# Patient Record
Sex: Male | Born: 1950 | Race: White | Hispanic: No | Smoking: Current every day smoker
Health system: Southern US, Community
[De-identification: ages and names within clinical notes are randomized; demographics above are authoritative.]

## PROBLEM LIST (undated history)

## (undated) DIAGNOSIS — Z8619 Personal history of other infectious and parasitic diseases: Secondary | ICD-10-CM

## (undated) DIAGNOSIS — I10 Essential (primary) hypertension: Secondary | ICD-10-CM

## (undated) DIAGNOSIS — E7800 Pure hypercholesterolemia, unspecified: Secondary | ICD-10-CM

## (undated) DIAGNOSIS — T7840XA Allergy, unspecified, initial encounter: Secondary | ICD-10-CM

## (undated) HISTORY — DX: Allergy, unspecified, initial encounter: T78.40XA

## (undated) HISTORY — DX: Personal history of other infectious and parasitic diseases: Z86.19

## (undated) HISTORY — PX: OTHER SURGICAL HISTORY: SHX169

---

## 2002-02-14 ENCOUNTER — Emergency Department (HOSPITAL_COMMUNITY): Admission: EM | Admit: 2002-02-14 | Discharge: 2002-02-14 | Payer: Self-pay

## 2003-06-28 ENCOUNTER — Emergency Department (HOSPITAL_COMMUNITY): Admission: EM | Admit: 2003-06-28 | Discharge: 2003-06-28 | Payer: Self-pay | Admitting: Emergency Medicine

## 2003-07-13 ENCOUNTER — Emergency Department (HOSPITAL_COMMUNITY): Admission: EM | Admit: 2003-07-13 | Discharge: 2003-07-13 | Payer: Self-pay | Admitting: Emergency Medicine

## 2005-05-14 ENCOUNTER — Emergency Department (HOSPITAL_COMMUNITY): Admission: EM | Admit: 2005-05-14 | Discharge: 2005-05-14 | Payer: Self-pay | Admitting: Emergency Medicine

## 2005-08-10 ENCOUNTER — Emergency Department (HOSPITAL_COMMUNITY): Admission: EM | Admit: 2005-08-10 | Discharge: 2005-08-11 | Payer: Self-pay | Admitting: Emergency Medicine

## 2005-08-16 ENCOUNTER — Encounter: Admission: RE | Admit: 2005-08-16 | Discharge: 2005-08-16 | Payer: Self-pay | Admitting: Family Medicine

## 2011-09-18 ENCOUNTER — Telehealth: Payer: Self-pay

## 2011-09-18 NOTE — Telephone Encounter (Signed)
Opened in error

## 2011-09-19 ENCOUNTER — Ambulatory Visit (INDEPENDENT_AMBULATORY_CARE_PROVIDER_SITE_OTHER): Payer: 59 | Admitting: Internal Medicine

## 2011-09-19 ENCOUNTER — Encounter: Payer: Self-pay | Admitting: Internal Medicine

## 2011-09-19 VITALS — BP 122/80 | HR 80 | Temp 98.2°F | Resp 20 | Ht 72.5 in | Wt 179.0 lb

## 2011-09-19 DIAGNOSIS — Z0000 Encounter for general adult medical examination without abnormal findings: Secondary | ICD-10-CM

## 2011-09-19 DIAGNOSIS — M1990 Unspecified osteoarthritis, unspecified site: Secondary | ICD-10-CM | POA: Insufficient documentation

## 2011-09-19 LAB — COMPREHENSIVE METABOLIC PANEL
ALT: 15 U/L (ref 0–53)
Alkaline Phosphatase: 62 U/L (ref 39–117)
BUN: 11 mg/dL (ref 6–23)
CO2: 27 mEq/L (ref 19–32)
Chloride: 105 mEq/L (ref 96–112)
Potassium: 3.9 mEq/L (ref 3.5–5.1)
Sodium: 139 mEq/L (ref 135–145)
Total Protein: 6.7 g/dL (ref 6.0–8.3)

## 2011-09-19 LAB — CBC WITH DIFFERENTIAL/PLATELET
Eosinophils Relative: 2.9 % (ref 0.0–5.0)
Hemoglobin: 15.1 g/dL (ref 13.0–17.0)
Lymphocytes Relative: 29 % (ref 12.0–46.0)
Lymphs Abs: 2.6 10*3/uL (ref 0.7–4.0)
Neutro Abs: 5.5 10*3/uL (ref 1.4–7.7)
RBC: 5.12 Mil/uL (ref 4.22–5.81)
RDW: 12.8 % (ref 11.5–14.6)
WBC: 9 10*3/uL (ref 4.5–10.5)

## 2011-09-19 LAB — TSH: TSH: 1.89 u[IU]/mL (ref 0.35–5.50)

## 2011-09-19 LAB — LIPID PANEL: Total CHOL/HDL Ratio: 7

## 2011-09-19 NOTE — Patient Instructions (Signed)
Limit your sodium (Salt) intake    It is important that you exercise regularly, at least 20 minutes 3 to 4 times per week.  If you develop chest pain or shortness of breath seek  medical attention.  Smoking tobacco is very bad for your health. You should stop smoking immediately.  Schedule your colonoscopy to help detect colon cancer.

## 2011-09-19 NOTE — Progress Notes (Signed)
Subjective:    Patient ID: Rodney Daniels, male    DOB: July 06, 1950, 61 y.o.   MRN: [id number]  HPI  61 year old patient who is seen today to establish with our practice. He enjoys excellent health. He is a 1- one half pack per day smoker. 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 He takes no chronic medications and uses Advil when necessary. Past medical history is otherwise unremarkable. He has had some borderline high blood pressure readings intermittently in the past and also has a history of hayfever. Surgical procedures have included a primary attachment about 25 years ago. He also had a circumcision at age 61 Social history married 2 children 1 son 1 daughter one pack per day smoker is within [place] since 1977 Family history. Mother alive at age 61 status post CABG. Father died of lung cancer 2 brothers one sister Brother with congestive heart failure     Review of Systems  Constitutional: Negative for fever, chills, activity change, appetite change and fatigue.  HENT: Negative for hearing loss, ear pain, congestion, rhinorrhea, sneezing, mouth sores, trouble swallowing, neck pain, neck stiffness, dental problem, voice change, sinus pressure and tinnitus.   Eyes: Negative for photophobia, pain, redness and visual disturbance.  Respiratory: Negative for apnea, cough, choking, chest tightness, shortness of breath and wheezing.   Cardiovascular: Negative for chest pain, palpitations and leg swelling.  Gastrointestinal: Negative for nausea, vomiting, abdominal pain, diarrhea, constipation, blood in stool, abdominal distention, anal bleeding and rectal pain.  Genitourinary: Negative for dysuria, urgency, frequency, hematuria, flank pain, decreased urine volume, discharge, penile swelling, scrotal swelling, difficulty urinating, genital sores and testicular pain.  Musculoskeletal: Positive for back pain (Occasional cervical and lumbar pain).  Negative for myalgias, joint swelling, arthralgias and gait problem.  Skin: Negative for color change, rash and wound.  Neurological: Negative for dizziness, tremors, seizures, syncope, facial asymmetry, speech difficulty, weakness, light-headedness, numbness and headaches.  Hematological: Negative for adenopathy. Does not bruise/bleed easily.  Psychiatric/Behavioral: Negative for suicidal ideas, hallucinations, behavioral problems, confusion, disturbed wake/sleep cycle, self-injury, dysphoric mood, decreased concentration and agitation. The patient is not nervous/anxious.        Objective:   Physical Exam  Constitutional: He appears well-developed and well-nourished.  HENT:  Head: Normocephalic and atraumatic.  Right Ear: External ear normal.  Left Ear: External ear normal.  Nose: Nose normal.  Mouth/Throat: Oropharynx is clear and moist.  Eyes: Conjunctivae and EOM are normal. Pupils are equal, round, and reactive to light. No scleral icterus.       Dentures in place  Neck: Normal range of motion. Neck supple. No JVD present. No thyromegaly present.  Cardiovascular: Regular rhythm, normal heart sounds and intact distal pulses.  Exam reveals no gallop and no friction rub.   No murmur heard.      Decreased right dorsalis pedis pulse  Pulmonary/Chest: Effort normal and breath sounds normal. He exhibits no tenderness.  Abdominal: Soft. Bowel sounds are normal. He exhibits no distension and no mass. There is no tenderness.  Genitourinary: Prostate normal and penis normal. Guaiac negative stool.       Prostate +2 enlarged Left testicle slightly enlarged compared to the right probable small hydrocele  Musculoskeletal: Normal range of motion. He exhibits no edema and no tenderness.       Status post right biceps tendon rupture Lipoma left shoulder area  Lymphadenopathy:    He has no cervical adenopathy.  Neurological: He is alert. He has normal  reflexes. No cranial nerve deficit.  Coordination normal.  Skin: Skin is warm and dry. No rash noted.  Psychiatric: He has a normal mood and affect. His behavior is normal.          Assessment & Plan:   Preventive health examination Tobacco abuse Osteoarthritis  Total cessation of smoking encouraged. We'll set up for a screening colonoscopy  And  screening labs

## 2011-09-22 ENCOUNTER — Other Ambulatory Visit: Payer: Self-pay | Admitting: *Deleted

## 2011-09-22 DIAGNOSIS — E785 Hyperlipidemia, unspecified: Secondary | ICD-10-CM

## 2011-09-22 MED ORDER — ATORVASTATIN CALCIUM 40 MG PO TABS: 40.0000 mg | ORAL_TABLET | Freq: Every day | ORAL | Status: DC

## 2011-09-22 NOTE — Progress Notes (Signed)
Quick Note:  Both phone numbers "I'm sorry, but this number has been disconnected". Lipitor sent to pharmacy, will send labs to pt home address with instructions highlighted. Future labs ordered ______

## 2012-01-01 ENCOUNTER — Ambulatory Visit (INDEPENDENT_AMBULATORY_CARE_PROVIDER_SITE_OTHER): Payer: 59 | Admitting: Internal Medicine

## 2012-01-01 ENCOUNTER — Encounter: Payer: Self-pay | Admitting: Internal Medicine

## 2012-01-01 VITALS — BP 112/70 | Temp 98.6°F | Wt 181.0 lb

## 2012-01-01 DIAGNOSIS — J069 Acute upper respiratory infection, unspecified: Secondary | ICD-10-CM

## 2012-01-01 MED ORDER — BENZONATATE 200 MG PO CAPS: 200.0000 mg | ORAL_CAPSULE | Freq: Two times a day (BID) | ORAL | Status: DC | PRN

## 2012-01-01 NOTE — Patient Instructions (Signed)
Mucinex DM twice daily Cough medication 3 times daily   Take over-the-counter expectorants and cough medications such as  Mucinex DM.  Call if there is no improvement in 5 to 7 days or if he developed worsening cough, fever, or new symptoms, such as shortness of breath or chest pain.  Smoking tobacco is very bad for your health. You should stop smoking immediately.

## 2012-01-01 NOTE — Progress Notes (Signed)
Subjective:    Patient ID: Rodney Daniels, male    DOB: 09/06/1950, 61 y.o.   MRN: [id number]  HPI  61 year old patient who has a history of a one pack per day tobacco use. He presents with a ten-day history of largely nonproductive cough. At times he has scanty sputum production. There's been no wheezing or shortness of breath. He has developed some neck chest discomfort related to the refractory coughing. 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  Past Medical History  Diagnosis Date  . Allergy   . History of chicken pox     History   Social History  . Marital Status: Married    Spouse Name: N/A    Number of Children: N/A  . Years of Education: N/A   Occupational History  . Not on file.   Social History Main Topics  . Smoking status: Current Every Day Smoker -- 1.0 packs/day    Types: Cigarettes  . Smokeless tobacco: Never Used  . Alcohol Use: Yes     rarley  . Drug Use: No  . Sexually Active: Not on file   Other Topics Concern  . Not on file   Social History Narrative  . No narrative on file    Past Surgical History  Procedure Date  . Thumb surgery     Family History  Problem Relation Age of Onset  . Heart disease Mother   . Cancer Father     lung    Allergies  Allergen Reactions  . Codeine     Current Outpatient Prescriptions on File Prior to Visit  Medication Sig Dispense Refill  . atorvastatin (LIPITOR) 40 MG tablet Take 1 tablet (40 mg total) by mouth daily.  90 tablet  3    BP 112/70  Temp 98.6 F (37 C) (Oral)  Wt 181 lb (82.101 kg)       Review of Systems  Constitutional: Positive for fatigue. Negative for fever, chills and appetite change.  HENT: Positive for congestion. Negative for hearing loss, ear pain, sore throat, trouble swallowing, neck stiffness, dental problem, voice change and tinnitus.   Eyes: Negative for pain, discharge and visual disturbance.  Respiratory: Positive for cough. Negative for chest tightness,  wheezing and stridor.   Cardiovascular: Negative for chest pain, palpitations and leg swelling.  Gastrointestinal: Negative for nausea, vomiting, abdominal pain, diarrhea, constipation, blood in stool and abdominal distention.  Genitourinary: Negative for urgency, hematuria, flank pain, discharge, difficulty urinating and genital sores.  Musculoskeletal: Negative for myalgias, back pain, joint swelling, arthralgias and gait problem.  Skin: Negative for rash.  Neurological: Negative for dizziness, syncope, speech difficulty, weakness, numbness and headaches.  Hematological: Negative for adenopathy. Does not bruise/bleed easily.  Psychiatric/Behavioral: Negative for behavioral problems and dysphoric mood. The patient is not nervous/anxious.        Objective:   Physical Exam  Constitutional: He is oriented to person, place, and time. He appears well-developed and well-nourished. No distress.       Blood pressure low normal Afebrile Appears older than stated age  HENT:  Head: Normocephalic.  Right Ear: External ear normal.  Left Ear: External ear normal.  Eyes: Conjunctivae normal and EOM are normal.  Neck: Normal range of motion.  Cardiovascular: Normal rate and normal heart sounds.   Pulmonary/Chest: Effort normal and breath sounds normal. No respiratory distress. He has no wheezes. He has no rales.  Abdominal: Bowel sounds are normal.  Musculoskeletal: Normal range of motion. He exhibits no edema and  no tenderness.  Neurological: He is alert and oriented to person, place, and time.  Psychiatric: He has a normal mood and affect. His behavior is normal.          Assessment & Plan:   Tobacco use URI with tracheobronchitis  We'll treat symptomatically Cessation of smoking encouraged

## 2012-03-15 ENCOUNTER — Ambulatory Visit (INDEPENDENT_AMBULATORY_CARE_PROVIDER_SITE_OTHER): Payer: 59 | Admitting: Internal Medicine

## 2012-03-15 ENCOUNTER — Encounter: Payer: Self-pay | Admitting: Internal Medicine

## 2012-03-15 ENCOUNTER — Ambulatory Visit (INDEPENDENT_AMBULATORY_CARE_PROVIDER_SITE_OTHER)
Admission: RE | Admit: 2012-03-15 | Discharge: 2012-03-15 | Disposition: A | Discharge status: Home / Self Care | Payer: 59 | Source: Ambulatory Visit | Attending: Internal Medicine | Admitting: Internal Medicine

## 2012-03-15 VITALS — BP 120/80 | HR 89 | Temp 98.8°F | Resp 18 | Wt 183.0 lb

## 2012-03-15 DIAGNOSIS — F17200 Nicotine dependence, unspecified, uncomplicated: Secondary | ICD-10-CM

## 2012-03-15 DIAGNOSIS — Z720 Tobacco use: Secondary | ICD-10-CM

## 2012-03-15 DIAGNOSIS — E785 Hyperlipidemia, unspecified: Secondary | ICD-10-CM

## 2012-03-15 DIAGNOSIS — J40 Bronchitis, not specified as acute or chronic: Secondary | ICD-10-CM

## 2012-03-15 DIAGNOSIS — M1990 Unspecified osteoarthritis, unspecified site: Secondary | ICD-10-CM

## 2012-03-15 MED ORDER — HYDROCODONE-HOMATROPINE 5-1.5 MG/5ML PO SYRP: 5.0000 mL | ORAL_SOLUTION | Freq: Four times a day (QID) | ORAL | Status: AC | PRN

## 2012-03-15 MED ORDER — ATORVASTATIN CALCIUM 40 MG PO TABS: 40.0000 mg | ORAL_TABLET | Freq: Every day | ORAL | Status: DC

## 2012-03-15 MED ORDER — DOXYCYCLINE HYCLATE 100 MG PO TABS: 100.0000 mg | ORAL_TABLET | Freq: Two times a day (BID) | ORAL | Status: DC

## 2012-03-15 NOTE — Progress Notes (Signed)
Subjective:    Patient ID: Rodney Daniels, male    DOB: 1951-03-16, 61 y.o.   MRN: [id number]  HPI  61 year old patient who is seen today with a chief complaint of persistent cough. He was seen here 2 months ago and treated symptomatically for cough which has persisted. He continues to be a one pack per day smoker. Cough is largely nonproductive. Other complaints include some pain and stiffness in the arms and legs usually worse after prolonged sitting. There's been no fever. No shortness of breath or wheezing.  He also complains of a left testicular swelling for several days left testicle is slightly tender He has a history of dyslipidemia but no longer is taking atorvastatin.  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 Allergy   . History of chicken pox     History   Social History  . Marital Status: Married    Spouse Name: N/A    Number of Children: N/A  . Years of Education: N/A   Occupational History  . Not on file.   Social History Main Topics  . Smoking status: Current Every Day Smoker -- 1.0 packs/day    Types: Cigarettes  . Smokeless tobacco: Never Used  . Alcohol Use: Yes     Comment: rarley  . Drug Use: No  . Sexually Active: Not on file   Other Topics Concern  . Not on file   Social History Narrative  . No narrative on file    Past Surgical History  Procedure Date  . Thumb surgery     Family History  Problem Relation Age of Onset  . Heart disease Mother   . Cancer Father     lung    No Active Allergies  Current Outpatient Prescriptions on File Prior to Visit  Medication Sig Dispense Refill  . atorvastatin (LIPITOR) 40 MG tablet Take 1 tablet (40 mg total) by mouth daily.  90 tablet  3  . benzonatate (TESSALON) 200 MG capsule Take 1 capsule (200  mg total) by mouth 2 (two) times daily as needed for cough.  20 capsule  0    BP 120/80  Pulse 89  Temp 98.8 F (37.1 C) (Oral)  Resp 18  Wt 183 lb (83.008 kg)  SpO2 97%       Review of Systems  Constitutional: Negative for fever, chills, appetite change and fatigue.  HENT: Negative for hearing loss, ear pain, congestion, sore throat, trouble swallowing, neck stiffness, dental problem, voice change and tinnitus.   Eyes: Negative for pain, discharge and visual disturbance.  Respiratory: Positive for cough. Negative for chest tightness, wheezing and stridor.   Cardiovascular: Negative for chest pain, palpitations and leg swelling.  Gastrointestinal: Negative for nausea, vomiting, abdominal pain, diarrhea, constipation, blood in stool and abdominal distention.  Genitourinary: Positive for scrotal swelling and testicular pain. Negative for urgency, hematuria, flank pain, discharge, difficulty urinating and genital sores.  Musculoskeletal: Negative for myalgias, back pain, joint swelling, arthralgias and gait problem.  Skin: Negative for rash.  Neurological: Negative for dizziness, syncope, speech difficulty, weakness, numbness and headaches.  Hematological: Negative for adenopathy. Does not bruise/bleed easily.  Psychiatric/Behavioral: Negative for behavioral problems and dysphoric mood. The patient is not nervous/anxious.        Objective:   Physical  Exam  Constitutional: He is oriented to person, place, and time. He appears well-developed.  HENT:  Head: Normocephalic.  Right Ear: External ear normal.  Left Ear: External ear normal.  Eyes: Conjunctivae normal and EOM are normal.  Neck: Normal range of motion.  Cardiovascular: Normal rate and normal heart sounds.   Pulmonary/Chest: Effort normal and breath sounds normal. No respiratory distress. He has no wheezes.  Abdominal: Bowel sounds are normal.  Genitourinary:       Left testicle mildly swollen and tender   Musculoskeletal: Normal range of motion. He exhibits no edema and no tenderness.  Neurological: He is alert and oriented to person, place, and time.  Psychiatric: He has a normal mood and affect. His behavior is normal.          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 Smoking cessation encouraged. Doubtful patient has a codeine allergy will treat with Hydromet and Mucinex DM. A chest x-ray will be reviewed

## 2012-03-15 NOTE — Patient Instructions (Signed)
Chest x-ray as discussed  Smoking tobacco is very bad for your health. You should stop smoking immediately.  Take your antibiotic as prescribed until ALL of it is gone, but stop if you develop a rash, swelling, or any side effects of the medication.  Contact our office as soon as possible if  there are side effects of the medication.

## 2012-05-06 ENCOUNTER — Other Ambulatory Visit: Payer: Self-pay | Admitting: Internal Medicine

## 2012-12-11 ENCOUNTER — Ambulatory Visit (INDEPENDENT_AMBULATORY_CARE_PROVIDER_SITE_OTHER): Payer: 59 | Admitting: Internal Medicine

## 2012-12-11 ENCOUNTER — Encounter: Payer: Self-pay | Admitting: Internal Medicine

## 2012-12-11 VITALS — BP 118/80 | HR 98 | Temp 98.6°F | Resp 20 | Wt 175.0 lb

## 2012-12-11 DIAGNOSIS — Z23 Encounter for immunization: Secondary | ICD-10-CM

## 2012-12-11 DIAGNOSIS — M1990 Unspecified osteoarthritis, unspecified site: Secondary | ICD-10-CM

## 2012-12-11 DIAGNOSIS — M25519 Pain in unspecified shoulder: Secondary | ICD-10-CM

## 2012-12-11 DIAGNOSIS — M25511 Pain in right shoulder: Secondary | ICD-10-CM

## 2012-12-11 MED ORDER — METHYLPREDNISOLONE ACETATE 80 MG/ML IJ SUSP
80.0000 mg | Freq: Once | INTRAMUSCULAR | Status: AC
  Administered 2012-12-11: 80 mg via INTRAMUSCULAR

## 2012-12-11 MED ORDER — MELOXICAM 15 MG PO TABS: 15.0000 mg | ORAL_TABLET | Freq: Every day | ORAL | Status: DC

## 2012-12-11 NOTE — Progress Notes (Signed)
Subjective:    Patient ID: Rodney Daniels, male    DOB: 1950/11/08, 62 y.o.   MRN: [id number]  HPI   62 year old patient who presents with chief complaint of right shoulder pain. He states that he was seen by orthopedics for what he describes as a frozen left shoulder. After 6-7 weeks of rehabilitation his left shoulder pain and stiffness has resolved. For the past month he has had worsening right shoulder discomfort with movement. No trauma.  Past Medical History  Diagnosis Date  . Allergy   . History of chicken pox     History   Social History  . Marital Status: Married    Spouse Name: N/A    Number of Children: N/A  . Years of Education: N/A   Occupational History  . Not on file.   Social History Main Topics  . Smoking status: Current Every Day Smoker -- 1.00 packs/day    Types: Cigarettes  . Smokeless tobacco: Never Used  . Alcohol Use: Yes     Comment: rarley  . Drug Use: No  . Sexual Activity: Not on file   Other Topics Concern  . Not on file   Social History Narrative  . No narrative on file    Past Surgical History  Procedure Laterality Date  . Thumb surgery      Family History  Problem Relation Age of Onset  . Heart disease Mother   . Cancer Father     lung    No Known Allergies  Current Outpatient Prescriptions on File Prior to Visit  Medication Sig Dispense Refill  . atorvastatin (LIPITOR) 40 MG tablet Take 1 tablet (40 mg total) by mouth daily.  90 tablet  3  . ibuprofen (ADVIL,MOTRIN) 200 MG tablet Take 400 mg by mouth every 6 (six) hours as needed.       No current facility-administered medications on file prior to visit.    BP 118/80  Pulse 98  Temp(Src) 98.6 F (37 C) (Oral)  Resp 20  Wt 175 lb (79.379 kg)  BMI 23.4 kg/m2  SpO2 98%       Review of Systems  Constitutional: Negative for fever, chills, appetite change and fatigue.  HENT: Negative for hearing loss, ear pain, congestion, sore throat, trouble swallowing, neck  stiffness, dental problem, voice change and tinnitus.   Eyes: Negative for pain, discharge and visual disturbance.  Respiratory: Negative for cough, chest tightness, wheezing and stridor.   Cardiovascular: Negative for chest pain, palpitations and leg swelling.  Gastrointestinal: Negative for nausea, vomiting, abdominal pain, diarrhea, constipation, blood in stool and abdominal distention.  Genitourinary: Negative for urgency, hematuria, flank pain, discharge, difficulty urinating and genital sores.  Musculoskeletal: Negative for myalgias, back pain, joint swelling, arthralgias and gait problem.       Right shoulder pain  Skin: Negative for rash.  Neurological: Negative for dizziness, syncope, speech difficulty, weakness, numbness and headaches.  Hematological: Negative for adenopathy. Does not bruise/bleed easily.  Psychiatric/Behavioral: Negative for behavioral problems and dysphoric mood. The patient is not nervous/anxious.        Objective:   Physical Exam  Constitutional: He appears well-developed and well-nourished. No distress.  Musculoskeletal:  Mildly positive painful arc test from 90-120 degrees Negative drop arm test Internal rotation leg test external rotation leg test as well as external rotation resistance test were all weakly positive          Assessment & Plan:   Right shoulder pain probable rotator cuff tendinopathy. Will  treat with Depo-Medrol 80 as well as with Mobic. Rehabilitation exercises dispensed

## 2012-12-11 NOTE — Patient Instructions (Signed)

## 2013-10-14 ENCOUNTER — Emergency Department (HOSPITAL_COMMUNITY)
Admission: EM | Admit: 2013-10-14 | Discharge: 2013-10-14 | Disposition: A | Discharge status: Home / Self Care | Payer: 59 | Attending: Emergency Medicine | Admitting: Emergency Medicine

## 2013-10-14 ENCOUNTER — Encounter (HOSPITAL_COMMUNITY): Payer: Self-pay | Admitting: Emergency Medicine

## 2013-10-14 DIAGNOSIS — M79609 Pain in unspecified limb: Secondary | ICD-10-CM | POA: Insufficient documentation

## 2013-10-14 DIAGNOSIS — Z8619 Personal history of other infectious and parasitic diseases: Secondary | ICD-10-CM | POA: Insufficient documentation

## 2013-10-14 DIAGNOSIS — Z8709 Personal history of other diseases of the respiratory system: Secondary | ICD-10-CM | POA: Insufficient documentation

## 2013-10-14 DIAGNOSIS — M7989 Other specified soft tissue disorders: Secondary | ICD-10-CM

## 2013-10-14 DIAGNOSIS — M79661 Pain in right lower leg: Secondary | ICD-10-CM

## 2013-10-14 DIAGNOSIS — F17200 Nicotine dependence, unspecified, uncomplicated: Secondary | ICD-10-CM | POA: Insufficient documentation

## 2013-10-14 MED ORDER — HYDROCODONE-ACETAMINOPHEN 5-325 MG PO TABS: 1.0000 | ORAL_TABLET | Freq: Four times a day (QID) | ORAL | Status: DC | PRN

## 2013-10-14 NOTE — Discharge Instructions (Signed)
Your ultrasound is normal  No evidence of blood clot   Please use Ibuprofen or tylenol as need for pain and the Hydrocodone as needed for severe pain . Make an appointment with your PCP for further evaluation

## 2013-10-14 NOTE — ED Provider Notes (Signed)
CSN: [id number]     Arrival date & time 10/14/13  1058 History   First MD Initiated Contact with Patient 10/14/13 1113     Chief Complaint  Patient presents with  . Leg Swelling     (Consider location/radiation/quality/duration/timing/severity/associated sxs/prior Treatment) HPI Comments: This is a CT or normally healthy, male, who presented to urgent care today with complaints of left lower cardiac swelling for 2 days.  He, states spontaneously swelled, most of the pain is in the posterior calf.  He has no risk factors for DVT.  He has not traveled.  No history of blood clots.  No history of trauma.  He does smoke a pack a day.  He does not take any normal.  Hormone replacement therapy, no history of previous DVT.  Denies shortness of breath, or chest discomfort. His only medical history is a disc in his back occasionally will cause him some discomfort, for which she takes Mobic and Advil.  He has not required this in quite some time  The history is provided by the patient.    Past Medical History  Diagnosis Date  . Allergy   . History of chicken pox    Past Surgical History  Procedure Laterality Date  . Thumb surgery     Family History  Problem Relation Age of Onset  . Heart disease Mother   . Cancer Father     lung   History  Substance Use Topics  . Smoking status: Current Every Day Smoker -- 1.00 packs/day    Types: Cigarettes  . Smokeless tobacco: Never Used  . Alcohol Use: Yes     Comment: rarley    Review of Systems  Constitutional: Negative for fever and chills.  Respiratory: Negative for shortness of breath.   Cardiovascular: Positive for leg swelling. Negative for chest pain.  Musculoskeletal: Negative for joint swelling.  Skin: Negative for rash and wound.  All other systems reviewed and are negative.     Allergies  Review of patient's allergies indicates no known allergies.  Home Medications   Prior to Admission medications   Medication Sig Start  Date End Date Taking? Authorizing Provider  HYDROcodone-acetaminophen (NORCO/VICODIN) 5-325 MG per tablet Take 1 tablet by mouth every 6 (six) hours as needed. 10/14/13   Arman Filter, NP   BP 127/86  Pulse 69  Temp(Src) 98.3 F (36.8 C) (Oral)  Resp 14  Ht 6\' 1"  (1.854 m)  Wt 167 lb (75.751 kg)  BMI 22.04 kg/m2  SpO2 97% Physical Exam  Nursing note and vitals reviewed. Constitutional: He appears well-developed and well-nourished.  HENT:  Head: Normocephalic.  Mouth/Throat: Oropharynx is clear and moist.  Eyes: Pupils are equal, round, and reactive to light.  Neck: Normal range of motion.  Cardiovascular: Normal rate and regular rhythm.   Pulmonary/Chest: Effort normal and breath sounds normal.  Musculoskeletal: He exhibits tenderness. He exhibits no edema.       Right lower leg: He exhibits tenderness and swelling. He exhibits no bony tenderness, no edema, no deformity and no laceration.       Legs: Neurological: He is alert.  Skin: Skin is warm. No rash noted. No erythema.    ED Course  Procedures (including critical care time) Labs Review Labs Reviewed - No data to display  Imaging Review No results found.   EKG Interpretation None      MDM  Ultrasound, is normal, not revealing a revealing an exact cause for his leg discomfort.  He has  been given a prescription for Vicodin, that he can use for severe pain.  He has been instructed to use Tylenol, ibuprofen for moderate pain and follow up with his primary care physician Final diagnoses:  Calf pain, right        Arman FilterGail K Denia Mcvicar, NP 10/14/13 1334  Arman FilterGail K Emmali Karow, NP 10/14/13 1334

## 2013-10-14 NOTE — ED Notes (Signed)
Pt is getting dressed and waiting for discharge paperwork at bedside.  

## 2013-10-14 NOTE — ED Provider Notes (Signed)
Medical screening examination/treatment/procedure(s) were performed by non-physician practitioner and as supervising physician I was immediately available for consultation/collaboration.   EKG Interpretation None        Courtney F Horton, MD 10/14/13 2202 

## 2013-10-14 NOTE — Progress Notes (Signed)
*  PRELIMINARY RESULTS* Vascular Ultrasound Right lower extremity venous duplex has been completed.  Preliminary findings: no evidence of DVT or baker's cyst.  Farrel DemarkJill Eunice, RDMS, RVT  10/14/2013, 12:49 PM

## 2013-10-14 NOTE — ED Notes (Signed)
Pt placed back on monitor upon returning to room from radiology. Pt  Continues to be monitored by 5 lead, blood pressure, and pulse ox.

## 2013-10-14 NOTE — ED Notes (Signed)
Pt placed into gown and on monitor upon arrival to room. Pt monitored by 5 lead, blood pressure, and pulse ox.  

## 2013-10-14 NOTE — ED Notes (Signed)
[facility] that started yesterday while at work. Pt denies any reddness, but reports tenderness noted to knee and below also increase in pain with walking. Pt denies taking any blood thinners, denies any recent travel or past hx of blood clots. Pt denies any sob or cp. nad noted. Axo x4.

## 2014-05-31 ENCOUNTER — Emergency Department (HOSPITAL_COMMUNITY)
Admission: EM | Admit: 2014-05-31 | Discharge: 2014-05-31 | Disposition: A | Discharge status: Home / Self Care | Payer: 59 | Attending: Emergency Medicine | Admitting: Emergency Medicine

## 2014-05-31 ENCOUNTER — Encounter (HOSPITAL_COMMUNITY): Payer: Self-pay | Admitting: *Deleted

## 2014-05-31 ENCOUNTER — Emergency Department (HOSPITAL_COMMUNITY): Payer: 59

## 2014-05-31 DIAGNOSIS — Z79899 Other long term (current) drug therapy: Secondary | ICD-10-CM | POA: Insufficient documentation

## 2014-05-31 DIAGNOSIS — Z720 Tobacco use: Secondary | ICD-10-CM | POA: Diagnosis not present

## 2014-05-31 DIAGNOSIS — N503 Cyst of epididymis: Secondary | ICD-10-CM

## 2014-05-31 DIAGNOSIS — N50812 Left testicular pain: Secondary | ICD-10-CM

## 2014-05-31 DIAGNOSIS — Z8619 Personal history of other infectious and parasitic diseases: Secondary | ICD-10-CM | POA: Diagnosis not present

## 2014-05-31 DIAGNOSIS — N508 Other specified disorders of male genital organs: Secondary | ICD-10-CM | POA: Diagnosis present

## 2014-05-31 LAB — URINALYSIS, ROUTINE W REFLEX MICROSCOPIC
BILIRUBIN URINE: NEGATIVE
GLUCOSE, UA: NEGATIVE mg/dL
HGB URINE DIPSTICK: NEGATIVE
Ketones, ur: NEGATIVE mg/dL
LEUKOCYTES UA: NEGATIVE
NITRITE: NEGATIVE
Protein, ur: NEGATIVE mg/dL
Specific Gravity, Urine: 1.018 (ref 1.005–1.030)
Urobilinogen, UA: 1 mg/dL (ref 0.0–1.0)
pH: 6 (ref 5.0–8.0)

## 2014-05-31 MED ORDER — HYDROCODONE-ACETAMINOPHEN 5-325 MG PO TABS: 1.0000 | ORAL_TABLET | ORAL | Status: DC | PRN

## 2014-05-31 NOTE — Discharge Instructions (Signed)
Take the prescribed medication as directed. Follow-up with urology-- call to schedule appt. Return to the ED for new or worsening symptoms.

## 2014-05-31 NOTE — ED Notes (Signed)
Pt reports left testicle swelling and pain 5/10. Reports hx of similar incident a few months ago and went to pcp and was given abx and pain resolved. Pt unsure how long this time testicle has been swollen. A dog jumped on pt and then his testicle started hurting.

## 2014-05-31 NOTE — ED Provider Notes (Signed)
CSN: [id number]     Arrival date & time 05/31/14  1227 History   First MD Initiated Contact with Patient 05/31/14 1241     Chief Complaint  Patient presents with  . Testicle Pain     (Consider location/radiation/quality/duration/timing/severity/associated sxs/prior Treatment) Patient is a 64 y.o. male presenting with testicular pain. The history is provided by the patient and medical records.  Testicle Pain  This is a 64 year old male with past medical history significant for seasonal allergies, presenting to the ED for left testicle pain and swelling. He states he is unsure how long his testicle has been swollen, but noticed the pain after his dog jumped on him. He states it feels like "my testicles been stretched". He states similar episode a few months ago which resolved after treatment with antibiotics. He denies any current urinary symptoms. No fever or chills. He has no prior history of testicular torsion or other testicular surgery. He is not currently followed by urologist.  Past Medical History  Diagnosis Date  . Allergy   . History of chicken pox    Past Surgical History  Procedure Laterality Date  . Thumb surgery     Family History  Problem Relation Age of Onset  . Heart disease Mother   . Cancer Father     lung   History  Substance Use Topics  . Smoking status: Current Every Day Smoker -- 1.00 packs/day    Types: Cigarettes  . Smokeless tobacco: Never Used  . Alcohol Use: Yes     Comment: rarley    Review of Systems  Genitourinary: Positive for testicular pain.  All other systems reviewed and are negative.     Allergies  Review of patient's allergies indicates no known allergies.  Home Medications   Prior to Admission medications   Medication Sig Start Date End Date Taking? Authorizing Provider  ibuprofen (ADVIL,MOTRIN) 200 MG tablet Take 400 mg by mouth every 6 (six) hours as needed for moderate pain.   Yes Historical Provider, MD  meloxicam (MOBIC)  15 MG tablet Take 15 mg by mouth daily as needed for pain.   Yes Historical Provider, MD  tiZANidine (ZANAFLEX) 2 MG tablet Take 2 mg by mouth 2 (two) times daily as needed for muscle spasms.   Yes Historical Provider, MD  HYDROcodone-acetaminophen (NORCO/VICODIN) 5-325 MG per tablet Take 1 tablet by mouth every 6 (six) hours as needed. Patient not taking: Reported on 05/31/2014 10/14/13   Arman FilterGail K Schulz, NP   BP 124/90 mmHg  Pulse 89  Temp(Src) 98 F (36.7 C) (Oral)  Resp 16  SpO2 100%   Physical Exam  Constitutional: He is oriented to person, place, and time. He appears well-developed and well-nourished.  HENT:  Head: Normocephalic and atraumatic.  Mouth/Throat: Oropharynx is clear and moist.  Eyes: Conjunctivae and EOM are normal. Pupils are equal, round, and reactive to light.  Neck: Normal range of motion.  Cardiovascular: Normal rate, regular rhythm and normal heart sounds.   Pulmonary/Chest: Effort normal and breath sounds normal. No respiratory distress. He has no wheezes.  Abdominal: Soft. Bowel sounds are normal. There is no tenderness. There is no guarding.  Genitourinary: Left testis shows swelling (mild) and tenderness. Circumcised.  Left testicle mildly swollen with tenderness along medial aspect; normal lie, no high riding testicle; no signs of abscess formation; no urethral discharge  Musculoskeletal: Normal range of motion.  Neurological: He is alert and oriented to person, place, and time.  Skin: Skin is warm and dry.  Psychiatric: He has a normal mood and affect.  Nursing note and vitals reviewed.   ED Course  Procedures (including critical care time) Labs Review Labs Reviewed  URINALYSIS, ROUTINE W REFLEX MICROSCOPIC    Imaging Review US Scrotum  05/31/2014   CLINICAL DATA:  64 year old male with left testicular pain and swelling for 3 months.  EXAM: SCROTAL ULTRASOUND  DOPPLER ULTRASOUND OF THE TESTICLES  TECHNIQUE: Complete ultrasound examination of the  testicles, epididymis, and other scrotal structures was performed. Color and spectral Doppler ultrasound were also utilized to evaluate blood flow to the testicles.  COMPARISON:  None.  FINDINGS: Right testicle  Measurements: 3.7 x 2.4 x 4.1 cm. No mass or microlithiasis visualized.  Left testicle  Measurements: 4.2 x 2.8 x 3.7 cm. No mass or microlithiasis visualized.  Right epididymis:  A 2 mm epididymal cyst/spermatocele noted.  Left epididymis: 3 adjacent epididymal cysts/spermatoceles are noted, the largest measuring 3.5 cm.  Hydrocele:  Small bilateral hydroceles noted.  Varicocele:  Mild bilateral varicoceles noted.  Pulsed Doppler interrogation of both testes demonstrates normal low resistance arterial and venous waveforms bilaterally.  IMPRESSION: Normal testicles.  No evidence of testicular torsion or mass.  3 adjacent left epididymal cysts/spermatoceles, the largest measuring 3.5 cm, likely representing the patient's palpable abnormality.  Small bilateral hydroceles and mild bilateral varicoceles.   Electronically Signed   By: Harmon Pier M.D.   On: 05/31/2014 15:19   [place] Art/ven Flow Abd Pelv Doppler  05/31/2014   CLINICAL DATA:  64 year old male with left testicular pain and swelling for 3 months.  EXAM: SCROTAL ULTRASOUND  DOPPLER ULTRASOUND OF THE TESTICLES  TECHNIQUE: Complete ultrasound examination of the testicles, epididymis, and other scrotal structures was performed. Color and spectral Doppler ultrasound were also utilized to evaluate blood flow to the testicles.  COMPARISON:  None.  FINDINGS: Right testicle  Measurements: 3.7 x 2.4 x 4.1 cm. No mass or microlithiasis visualized.  Left testicle  Measurements: 4.2 x 2.8 x 3.7 cm. No mass or microlithiasis visualized.  Right epididymis:  A 2 mm epididymal cyst/spermatocele noted.  Left epididymis: 3 adjacent epididymal cysts/spermatoceles are noted, the largest measuring 3.5 cm.  Hydrocele:  Small bilateral hydroceles noted.  Varicocele:  Mild  bilateral varicoceles noted.  Pulsed Doppler interrogation of both testes demonstrates normal low resistance arterial and venous waveforms bilaterally.  IMPRESSION: Normal testicles.  No evidence of testicular torsion or mass.  3 adjacent left epididymal cysts/spermatoceles, the largest measuring 3.5 cm, likely representing the patient's palpable abnormality.  Small bilateral hydroceles and mild bilateral varicoceles.   Electronically Signed   By: Harmon Pier M.D.   On: 05/31/2014 15:19     EKG Interpretation None      MDM   Final diagnoses:  Pain in left testicle   64 year old male with left testicle pain and swelling. States worse today after dog jumped in his lap. On exam, there is mild swelling of the left testicle when compared with right but there is no abnormal lie or high riding testicle.  UA clean. Ultrasound negative for torsion or epididymitis, cysts are noted.  Suspect increased pain from trauma with dog jumping on him.  Patient will be d/c home with pain meds, supportive care.  He was given urology follow-up.  Discussed plan with patient, he/she acknowledged understanding and agreed with plan of care.  Return precautions given for new or worsening symptoms.  Garlon Hatchet, PA-C 05/31/14 1536  Lorre Nick, MD 06/03/14 2152

## 2016-02-27 ENCOUNTER — Other Ambulatory Visit (INDEPENDENT_AMBULATORY_CARE_PROVIDER_SITE_OTHER): Payer: Self-pay | Admitting: Orthopaedic Surgery

## 2016-05-30 ENCOUNTER — Ambulatory Visit (INDEPENDENT_AMBULATORY_CARE_PROVIDER_SITE_OTHER): Payer: 59

## 2016-05-30 ENCOUNTER — Encounter (INDEPENDENT_AMBULATORY_CARE_PROVIDER_SITE_OTHER): Payer: Self-pay | Admitting: Orthopaedic Surgery

## 2016-05-30 ENCOUNTER — Ambulatory Visit (INDEPENDENT_AMBULATORY_CARE_PROVIDER_SITE_OTHER): Payer: 59 | Admitting: Orthopaedic Surgery

## 2016-05-30 ENCOUNTER — Encounter (INDEPENDENT_AMBULATORY_CARE_PROVIDER_SITE_OTHER): Payer: Self-pay

## 2016-05-30 DIAGNOSIS — M542 Cervicalgia: Secondary | ICD-10-CM

## 2016-05-30 MED ORDER — DICLOFENAC SODIUM 75 MG PO TBEC: 75.0000 mg | DELAYED_RELEASE_TABLET | Freq: Two times a day (BID) | ORAL | 2 refills | Status: DC

## 2016-05-30 MED ORDER — PREDNISONE 10 MG (21) PO TBPK: ORAL_TABLET | ORAL | 0 refills | Status: DC

## 2016-05-30 MED ORDER — METHOCARBAMOL 500 MG PO TABS: 500.0000 mg | ORAL_TABLET | Freq: Four times a day (QID) | ORAL | 2 refills | Status: DC | PRN

## 2016-05-30 NOTE — Progress Notes (Signed)
Office Visit Note   [name]           Date of Birth: [birth year]           MRN: [id number] Visit Date: 05/30/2016              Requested by: Gordy SaversPeter F Kwiatkowski, MD [address] [place], [place] [address] PCP: Rogelia BogaKWIATKOWSKI,PETER FRANK, MD   Assessment & Plan: Visit Diagnoses:  1. 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 Sounds like he is having a flareup of his radiculitis. I gave him prescription for prednisone, Robaxin and diclofenac. Follow-up if not better. He consider MRI  Follow-Up Instructions: Return if symptoms worsen or fail to improve.   Orders:  Orders Placed This Encounter  Procedures  . XR Cervical Spine 2 or 3 views   Meds ordered this encounter  Medications  . predniSONE (STERAPRED UNI-PAK 21 TAB) 10 MG (21) TBPK tablet    Sig: Take as directed    Dispense:  21 tablet    Refill:  0  . methocarbamol (ROBAXIN) 500 MG tablet    Sig: Take 1 tablet (500 mg total) by mouth every 6 (six) hours as needed for muscle spasms.    Dispense:  30 tablet    Refill:  2  . diclofenac (VOLTAREN) 75 MG EC tablet    Sig: Take 1 tablet (75 mg total) by mouth 2 (two) times daily.    Dispense:  30 tablet    Refill:  2      Procedures: No procedures performed   Clinical Data: No additional findings.   Subjective: Chief Complaint  Patient presents with  . Neck - Pain    Patient comes in today with right neck and shoulder pain. He denies any injuries. He having pain for about 6 weeks. Feels like a pulling sensation. He is not taking any pain medicines. He is currently working.    Review of Systems  Constitutional: Negative.   All other systems reviewed and are negative.    Objective: Vital Signs: There were no vitals taken for this visit.  Physical Exam  Constitutional: He is oriented to person, place, and time. He appears well-developed and well-nourished.    Pulmonary/Chest: Effort normal.  Abdominal: Soft.  Neurological: He is alert and oriented to person, place, and time.  Skin: Skin is warm.  Psychiatric: He has a normal mood and affect. His behavior is normal. Judgment and thought content normal.  Nursing note and vitals reviewed.   Ortho Exam The right shoulder is benign. Positive Spurling sign. Specialty Comments:  No specialty comments available.  Imaging: No results found.   PMFS History: Patient Active Problem List   Diagnosis Date Noted  . Cervicalgia 05/30/2016  . Dyslipidemia 03/15/2012  . Tobacco abuse 03/15/2012  . Osteoarthritis 09/19/2011   Past Medical History:  Diagnosis Date  . Allergy   . History of chicken pox     Family History  Problem Relation Age of Onset  . Heart disease Mother   . Cancer Father     lung    Past Surgical History:  Procedure Laterality Date  . thumb surgery     Social History   Occupational History  . Not on file.   Social History Main Topics  . Smoking status: Current Every Day Smoker    Packs/day: 1.00    Types: Cigarettes  . Smokeless tobacco: Never Used  .  Alcohol use Yes     Comment: rarley  . Drug use: No  . Sexual activity: Not on file

## 2016-08-29 ENCOUNTER — Ambulatory Visit (INDEPENDENT_AMBULATORY_CARE_PROVIDER_SITE_OTHER): Payer: 59

## 2016-08-29 ENCOUNTER — Encounter (INDEPENDENT_AMBULATORY_CARE_PROVIDER_SITE_OTHER): Payer: Self-pay | Admitting: Orthopaedic Surgery

## 2016-08-29 ENCOUNTER — Ambulatory Visit (INDEPENDENT_AMBULATORY_CARE_PROVIDER_SITE_OTHER): Payer: 59 | Admitting: Orthopaedic Surgery

## 2016-08-29 DIAGNOSIS — M25531 Pain in right wrist: Secondary | ICD-10-CM | POA: Diagnosis not present

## 2016-08-29 DIAGNOSIS — M542 Cervicalgia: Secondary | ICD-10-CM

## 2016-08-29 MED ORDER — METHYLPREDNISOLONE ACETATE 40 MG/ML IJ SUSP
20.0000 mg | INTRAMUSCULAR | Status: AC | PRN
  Administered 2016-08-29: 20 mg

## 2016-08-29 MED ORDER — LIDOCAINE HCL 1 % IJ SOLN
0.5000 mL | INTRAMUSCULAR | Status: AC | PRN
  Administered 2016-08-29: .5 mL

## 2016-08-29 MED ORDER — BUPIVACAINE HCL 0.5 % IJ SOLN
0.5000 mL | INTRAMUSCULAR | Status: AC | PRN
  Administered 2016-08-29: .5 mL

## 2016-08-29 NOTE — Progress Notes (Addendum)
Office Visit Note   [name]           Date of Birth: 1951/03/23           MRN: [id number] Visit Date: 08/29/2016              Requested by: Gordy SaversKwiatkowski, Peter F, MD [address] [place], [place] [address] PCP: Gordy SaversKwiatkowski, Peter F, MD   Assessment & Plan: Visit Diagnoses:  1. Pain in right wrist   2. Neck pain     Plan: MRI of the cervical spine ordered to evaluate for structural abnormality. Right volar wrist ganglion cyst was aspirated and then injected with cortisone. Wrist brace for immobilization for about 4 weeks. Follow-up in 2 weeks to review the MRI of the cervical spine.  Follow-Up Instructions: Return in about 2 weeks (around 09/12/2016).   Orders:  Orders Placed This Encounter  Procedures  . XR Wrist Complete Right  . MR Cervical Spine w/o contrast   No orders of the defined types were placed in this encounter.     Procedures: Hand/UE Inj Date/Time: 08/29/2016 3:43 PM Performed by: Tarry KosXU, Hillard Goodwine M Authorized by: Tarry KosXU, Encarnacion Bole M   Indications:  Pain Condition: volar carpal ganglion   Site:  R wrist Needle Size:  18 G Approach:  Volar Ultrasound Guidance: No   Medications:  0.5 mL lidocaine 1 %; 0.5 mL bupivacaine 0.5 %; 20 mg methylPREDNISolone acetate 40 MG/ML     Clinical Data: No additional findings.   Subjective: Chief Complaint  Patient presents with  . Right Wrist - Pain  . Neck - Pain    Patient comes back today for worsening neck and shoulder pain. I treated him extensively for cervical radiculopathy. He had MRI about 11 years ago. He also has had development of a right volar wrist ganglion cysts. 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 This is been causing him discomfort. He is right-handed.    Review of Systems  Constitutional: Negative.   All other systems reviewed and are negative.    Objective: Vital Signs: There were no vitals taken for this visit.  Physical Exam  Constitutional: He is oriented  to person, place, and time. He appears well-developed and well-nourished.  Pulmonary/Chest: Effort normal.  Abdominal: Soft.  Neurological: He is alert and oriented to person, place, and time.  Skin: Skin is warm.  Psychiatric: He has a normal mood and affect. His behavior is normal. Judgment and thought content normal.  Nursing note and vitals reviewed.   Ortho Exam Right wrist exam shows a palpable volar ganglion cyst with overlying skin changes. There is a strong pulse radial to this cyst. Cervical spine exam is unchanged. No focal deficits or findings. Specialty Comments:  No specialty comments available.  Imaging: Xr Wrist Complete Right  Result Date: 08/29/2016 No acute or structural abnormalities.    PMFS History: Patient Active Problem List   Diagnosis Date Noted  . Cervicalgia 05/30/2016  . Dyslipidemia 03/15/2012  . Tobacco abuse 03/15/2012  . Osteoarthritis 09/19/2011   Past Medical History:  Diagnosis Date  . Allergy   . History of chicken pox     Family History  Problem Relation Age of Onset  . Heart disease Mother   . Cancer Father        lung    Past Surgical History:  Procedure Laterality Date  . thumb surgery     Social History   Occupational History  . Not on file.  Social History Main Topics  . Smoking status: Current Every Day Smoker    Packs/day: 1.00    Types: Cigarettes  . Smokeless tobacco: Never Used  . Alcohol use Yes     Comment: rarley  . Drug use: No  . Sexual activity: Not on file

## 2016-09-08 ENCOUNTER — Ambulatory Visit
Admission: RE | Admit: 2016-09-08 | Discharge: 2016-09-08 | Disposition: A | Discharge status: Home / Self Care | Payer: 59 | Source: Ambulatory Visit | Attending: Orthopaedic Surgery | Admitting: Orthopaedic Surgery

## 2016-09-08 DIAGNOSIS — M542 Cervicalgia: Secondary | ICD-10-CM

## 2016-09-12 ENCOUNTER — Encounter (INDEPENDENT_AMBULATORY_CARE_PROVIDER_SITE_OTHER): Payer: Self-pay | Admitting: Orthopaedic Surgery

## 2016-09-12 ENCOUNTER — Ambulatory Visit (INDEPENDENT_AMBULATORY_CARE_PROVIDER_SITE_OTHER): Payer: 59 | Admitting: Orthopaedic Surgery

## 2016-09-12 DIAGNOSIS — M5412 Radiculopathy, cervical region: Secondary | ICD-10-CM

## 2016-09-12 NOTE — Progress Notes (Signed)
   Office Visit Note   [name]           Date of Birth: 1950-09-22           MRN: [id number] Visit Date: 09/12/2016              Requested by: Gordy SaversKwiatkowski, Peter F, MD [address] [place], [place] [address] PCP: Gordy SaversKwiatkowski, Peter F, MD   Assessment & Plan: Visit Diagnoses:  1. Cervical radiculopathy     Plan: MRI shows multilevel degenerative disc disease of cervical spine. Referral to Dr. Alvester MorinNewton for epidural steroid injection. Follow-up with me as needed.  Follow-Up Instructions: Return if symptoms worsen or fail to improve.   Orders:  Orders Placed This Encounter  Procedures  . Ambulatory referral to Physical Medicine Rehab   No orders of the defined types were placed in this encounter.     Procedures: No procedures performed   Clinical Data: No additional findings.   Subjective: Chief Complaint  Patient presents with  . Neck - Follow-up    Rodney Daniels comes back today to review his cervical spine MRI. He is doing about the same clinically. Noted new numbness or symptoms.    Review of Systems   Objective: Vital Signs: There were no vitals taken for this visit.  Physical Exam  Ortho Exam Exam is stable. Specialty Comments:  No specialty comments available.  Imaging: No results found.   PMFS History: Patient Active Problem List   Diagnosis Date Noted  . Cervicalgia 05/30/2016  . Dyslipidemia 03/15/2012  . Tobacco abuse 03/15/2012  . Osteoarthritis 09/19/2011   Past Medical History:  Diagnosis Date  . Allergy   . History of chicken pox     Family History  Problem Relation Age of Onset  . Heart disease Mother   . Cancer Father        lung    Past Surgical History:  Procedure Laterality Date  . thumb surgery     Social History   Occupational History  . Not on file.   Social History Main Topics  . Smoking status: Current Every Day Smoker    Packs/day: 1.00    Types: Cigarettes  . Smokeless tobacco:  Never Used  . Alcohol use Yes     Comment: rarley  . Drug use: No  . Sexual activity: Not on file

## 2016-09-28 ENCOUNTER — Other Ambulatory Visit (INDEPENDENT_AMBULATORY_CARE_PROVIDER_SITE_OTHER): Payer: Self-pay | Admitting: Orthopaedic Surgery

## 2016-09-29 NOTE — Telephone Encounter (Signed)
Ok to refill 

## 2016-10-02 ENCOUNTER — Encounter (INDEPENDENT_AMBULATORY_CARE_PROVIDER_SITE_OTHER): Payer: Self-pay | Admitting: Physical Medicine and Rehabilitation

## 2016-10-02 ENCOUNTER — Ambulatory Visit (INDEPENDENT_AMBULATORY_CARE_PROVIDER_SITE_OTHER): Payer: 59

## 2016-10-02 ENCOUNTER — Ambulatory Visit (INDEPENDENT_AMBULATORY_CARE_PROVIDER_SITE_OTHER): Payer: 59 | Admitting: Physical Medicine and Rehabilitation

## 2016-10-02 VITALS — BP 145/99 | HR 68 | Temp 98.1°F | Ht 73.0 in | Wt 174.0 lb

## 2016-10-02 DIAGNOSIS — G8929 Other chronic pain: Secondary | ICD-10-CM

## 2016-10-02 DIAGNOSIS — M47812 Spondylosis without myelopathy or radiculopathy, cervical region: Secondary | ICD-10-CM

## 2016-10-02 DIAGNOSIS — M25511 Pain in right shoulder: Secondary | ICD-10-CM

## 2016-10-02 DIAGNOSIS — M542 Cervicalgia: Secondary | ICD-10-CM

## 2016-10-02 MED ORDER — METHYLPREDNISOLONE ACETATE 80 MG/ML IJ SUSP
80.0000 mg | Freq: Once | INTRAMUSCULAR | Status: AC
  Administered 2016-10-02: 80 mg

## 2016-10-02 MED ORDER — LIDOCAINE HCL (PF) 1 % IJ SOLN
2.0000 mL | Freq: Once | INTRAMUSCULAR | Status: AC
  Administered 2016-10-02: 2 mL

## 2016-10-02 NOTE — Patient Instructions (Signed)

## 2016-10-02 NOTE — Progress Notes (Deleted)
Neck pain with radiating pain down right arm to right elbow. Chronic pain worse 2-3 years. Increased activity with reaching and pulling will cause pain to increase throughout day.  Robaxin and ibuprofen.    No contrast allergy. Has driver, son.

## 2016-10-02 NOTE — Procedures (Signed)
Cervical Facet Joint Intra-Articular Injection with Fluoroscopic Guidance  Patient: Rodney IraniDavid M Daniels      Date of Birth: 04-Jul-1950 MRN: [id number] PCP: Gordy SaversKwiatkowski, Peter F, MD      Visit Date: 10/02/2016   Universal Protocol:    Date/Time: 07/09/189:33 AM  Consent Given By: the patient  Position: lateral  Additional Comments: Vital signs were monitored before and after the procedure. Patient was prepped and draped in the usual sterile fashion. The correct patient, procedure, and site was verified.   Injection Procedure Details:  Procedure Site One Meds Administered:  Meds ordered this encounter  Medications  . lidocaine (PF) (XYLOCAINE) 1 % injection 2 mL  . methylPREDNISolone acetate (DEPO-MEDROL) injection 80 mg     Laterality: Right  Location/Site:  C3-4  Needle size: 22 G  Needle type: spinal needle  Needle Placement: Articular  Findings:  -Contrast Used: 0.5 mL iohexol 180 mg iodine/mL   -Comments: Excellent flow of contrast producing a partial arthrogram.  Procedure Details: The fluoroscope beam was manipulated to achieve the best "true" lateral view possible by squaring off the endplates with cranial and caudal tilt and using varying obliquity to achieve the a view with the longest length of spinous process.   The region overlying the facet joints mentioned above were then localized under fluoroscopic visualization. For each target described below the skin was anesthetized with 1 ml of 1% Lidocaine without epinephrine. The needle was inserted down to the level of the lateral mass of the superior articular process of the  facet joint to be injected. Then, the needle was "walked off" inferiorly into the lateral aspect of the facet joint. Bi-planar images were used for confirming placement and spot radiographs were documented. Radiographs were obtained of the arthrogram. A 0.5 ml. volume of the steroid/anesthetic solution was injected into the joint. This  procedure was repeated for each facet joint injected.   Additional Comments:  The patient tolerated the procedure well No complications occurred Dressing: Band-Aid    Post-procedure details: Patient was observed during the procedure. Post-procedure instructions were reviewed.  Patient left the clinic in stable condition.

## 2016-10-03 NOTE — Progress Notes (Signed)
Rodney Daniels [facility] - 66 y.o. male MRN [id number]  Date of birth: [birth year]  Office Visit Note: Visit Date: 10/02/2016 PCP: Gordy Savers, MD Referred by: Gordy Savers, MD  Subjective: Chief Complaint  Patient presents with  . Neck - Pain   HPI: Rodney Daniels it is a 66 year old right-hand dominant male who is been followed by Dr. Roda Shutters in our office for chronic worsening neck pain and cervicalgia with radiating symptoms to the right trapezius and sometimes right arm. He reports 2-3 years of worsening symptoms. 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 He has to do a lot of lifting and moving and he feels like maybe this is exacerbated his shoulder. Dr. Roda Shutters has evaluated his shoulders at length and have not found any real issues with the shoulders clinically or with imaging. The patient had a prior MRI in 2007 of the cervical spine showing general spondylosis particularly at C5-6 but without frank central canal stenosis or nerve compression or disc herniation. He was having right-sided symptoms at the time when I was done. He has had therapy in the past and continues to be active. He has taken all manner of nonsteroidal anti-inflammatories including diclofenac and meloxicam. He has taken different muscle relaxers. He has been on opioid pain medications at one point. Without much relief at all. He relates most of his symptoms of the right mid cervical spine region posteriorly that radiated into the trapezius area mainly as well as the upper shoulder blade. Recently he's had some referral pattern into the shoulder area laterally. He has no left-sided complaints. He has no numbness tingling or paresthesias in the hands. He's had no focal weakness. He's had no associated headaches. No specific trauma. Dr. Roda Shutters did update his MRI which is reviewed below and again shows mostly spondylitic facet arthropathy with new finding of slight  retrolisthesis at C3-4 which is progressive as well as right-sided spurring at C3-4.    Review of Systems  Constitutional: Negative for chills, fever, malaise/fatigue and weight loss.  HENT: Negative for hearing loss and sinus pain.   Eyes: Negative for blurred vision, double vision and photophobia.  Respiratory: Negative for cough and shortness of breath.   Cardiovascular: Negative for chest pain, palpitations and leg swelling.  Gastrointestinal: Negative for abdominal pain, nausea and vomiting.  Genitourinary: Negative for flank pain.  Musculoskeletal: Positive for joint pain and neck pain. Negative for myalgias.  Skin: Negative for itching and rash.  Neurological: Negative for tremors, focal weakness and weakness.  Endo/Heme/Allergies: Negative.   Psychiatric/Behavioral: Negative for depression.  All other systems reviewed and are negative.  Otherwise per HPI.  Assessment & Plan: Visit Diagnoses:  1. Cervical spondylosis without myelopathy   2. Cervicalgia   3. Chronic right shoulder pain     Plan: Findings:  Chronic worsening axial neck pain and right-sided with referral to the trapezius area mainly with some referral into the top of the shoulder. This could be radicular pain or facet pain. He gets some of his pain with rotation and looking up. He has more arthritic changes than real compression. He does have some foraminal narrowing on the right at C3-4 but is moderate. He has no central canal stenosis or focal disc herniations. I think his symptoms are mainly facet mediated pain and may be myofascial pain. Going to complete a right C3-4 facet joint block. Depending on the relief could look at either  diagnostic see for transforaminal epidural injection versus intralaminar epidural injection. He may do well regrouping with physical therapy for dry needling. His medications are adequate. He'll return to see Dr. Roda Shutters as needed as well. We did complete the injection today due to the severity  of complaints.    Meds & Orders:  Meds ordered this encounter  Medications  . lidocaine (PF) (XYLOCAINE) 1 % injection 2 mL  . methylPREDNISolone acetate (DEPO-MEDROL) injection 80 mg    Orders Placed This Encounter  Procedures  . Facet Injection  . XR C-ARM NO REPORT    Follow-up: Return if symptoms worsen or fail to improve, for possible right C4  transforaminal epidural.   Procedures: No procedures performed  Cervical Facet Joint Intra-Articular Injection with Fluoroscopic Guidance  [name]      Date of Birth: 10-Jul-1950 MRN: [id number] PCP: Gordy Savers, MD      Visit Date: 10/02/2016   Universal Protocol:    Date/Time: 07/09/189:33 AM  Consent Given By: the patient  Position: lateral  Additional Comments: Vital signs were monitored before and after the procedure. Patient was prepped and draped in the usual sterile fashion. The correct patient, procedure, and site was verified.   Injection Procedure Details:  Procedure Site One Meds Administered:  Meds ordered this encounter  Medications  . lidocaine (PF) (XYLOCAINE) 1 % injection 2 mL  . methylPREDNISolone acetate (DEPO-MEDROL) injection 80 mg     Laterality: Right  Location/Site:  C3-4  Needle size: 22 G  Needle type: spinal needle  Needle Placement: Articular  Findings:  -Contrast Used: 0.5 mL iohexol 180 mg iodine/mL   -Comments: Excellent flow of contrast producing a partial arthrogram.  Procedure Details: The fluoroscope beam was manipulated to achieve the best "true" lateral view possible by squaring off the endplates with cranial and caudal tilt and using varying obliquity to achieve the a view with the longest length of spinous process.   The region overlying the facet joints mentioned above were then localized under fluoroscopic visualization. For each target described below the skin was anesthetized with 1 ml of 1% Lidocaine without epinephrine. The needle was  inserted down to the level of the lateral mass of the superior articular process of the  facet joint to be injected. Then, the needle was "walked off" inferiorly into the lateral aspect of the facet joint. Bi-planar images were used for confirming placement and spot radiographs were documented. Radiographs were obtained of the arthrogram. A 0.5 ml. volume of the steroid/anesthetic solution was injected into the joint. This procedure was repeated for each facet joint injected.   Additional Comments:  The patient tolerated the procedure well No complications occurred Dressing: Band-Aid    Post-procedure details: Patient was observed during the procedure. Post-procedure instructions were reviewed.  Patient left the clinic in stable condition.    Clinical History: MRI CERVICAL SPINE WITHOUT CONTRAST  TECHNIQUE: Multiplanar, multisequence MR imaging of the cervical spine was performed. No intravenous contrast was administered.  COMPARISON:  MRI of the cervical spine 08/16/2005  FINDINGS: Alignment: Sight retrolisthesis at C5-6 and C6-7 is stable. Slight degenerative retrolisthesis is now present at C3-4. AP alignment is otherwise anatomic.  Vertebrae: Vertebral body heights are preserved. Marrow signal is slightly heterogeneous with fatty infiltration. No discrete lesions are present.  Cord: Normal signal is present in the cervical and upper thoracic spinal cord to the lowest imaged level, T2-3.  Posterior Fossa, vertebral arteries, paraspinal tissues: The craniocervical junction is normal. Flow  is present in the vertebral arteries bilaterally. The visualized posterior fossa is unremarkable.  Disc levels:  C2-3:  Negative.  C3-4: Progressive right-sided uncovertebral and facet spurring leads to moderate right foraminal narrowing. The central canal and left foramen are patent.  C4-5: Progressive uncovertebral and facet disease lead to mild foraminal narrowing,  right greater than left.  C5-6: A broad-based disc osteophyte complex is asymmetric to left. Severe left and moderate right foraminal narrowing have progressed.  C6-7: A broad-based disc osteophyte complex is present. Uncovertebral spurring leads to progressive moderate foraminal narrowing bilaterally, left greater than right.  C7-T1:  Negative.  IMPRESSION: 1. Progressive multilevel spondylosis of the cervical spine as described. 2. New moderate right foraminal narrowing at C3-4 secondary to progressive right-sided uncovertebral and facet disease. 3. Mild foraminal narrowing bilaterally at C4-5 is worse on the right. 4. Progressive severe left and moderate right foraminal narrowing at C5-6. 5. Progressive moderate foraminal narrowing bilaterally at C6-7 is worse on the left.   Electronically Signed   By: Marin Robertshristopher  Mattern M.D.   On: 09/08/2016 15:24  He reports that he has been smoking Cigarettes.  He has been smoking about 1.00 pack per day. He has never used smokeless tobacco. No results for input(s): HGBA1C, LABURIC in the last 8760 hours.  Objective:  VS:  HT:6\' 1"  (185.4 cm)   WT:174 lb (78.9 kg)  BMI:23    BP:(!) 145/99  HR:68bpm  TEMP:98.1 F (36.7 C)( )  RESP:97 % Physical Exam  Constitutional: He is oriented to person, place, and time. He appears well-developed and well-nourished. No distress.  HENT:  Head: Normocephalic and atraumatic.  Eyes: Conjunctivae are normal. Pupils are equal, round, and reactive to light.  Neck: Neck supple. No tracheal deviation present.  Cardiovascular: Regular rhythm and intact distal pulses.   Pulmonary/Chest: Effort normal. No respiratory distress.  Musculoskeletal:  Patient sits with forward flexed cervical spine. He has some trigger points which are active in the trapezius and levator scapula on the right more than left. Has no shoulder impingement signs. He has good strength with wrist extension long finger flexion  and abduction. He has an equivocal Spurling's test to the right.  Lymphadenopathy:    He has no cervical adenopathy.  Neurological: He is alert and oriented to person, place, and time.  Skin: Skin is warm and dry. No rash noted. No erythema.  Psychiatric: He has a normal mood and affect.  Nursing note and vitals reviewed.   Ortho Exam Imaging: Xr C-arm No Report  Result Date: 10/02/2016 Please see Notes or Procedures tab for imaging impression.   Past Medical/Family/Surgical/Social History: Medications & Allergies reviewed per EMR Patient Active Problem List   Diagnosis Date Noted  . Cervicalgia 05/30/2016  . Dyslipidemia 03/15/2012  . Tobacco abuse 03/15/2012  . Osteoarthritis 09/19/2011   Past Medical History:  Diagnosis Date  . Allergy   . History of chicken pox    Family History  Problem Relation Age of Onset  . Heart disease Mother   . Cancer Father        lung   Past Surgical History:  Procedure Laterality Date  . thumb surgery     Social History   Occupational History  . Not on file.   Social History Main Topics  . Smoking status: Current Every Day Smoker    Packs/day: 1.00    Types: Cigarettes  . Smokeless tobacco: Never Used  . Alcohol use Yes     Comment: rarley  .  Drug use: No  . Sexual activity: Not on file

## 2017-07-24 ENCOUNTER — Ambulatory Visit (INDEPENDENT_AMBULATORY_CARE_PROVIDER_SITE_OTHER): Payer: Self-pay

## 2017-07-24 ENCOUNTER — Ambulatory Visit (INDEPENDENT_AMBULATORY_CARE_PROVIDER_SITE_OTHER): Payer: 59

## 2017-07-24 ENCOUNTER — Other Ambulatory Visit (INDEPENDENT_AMBULATORY_CARE_PROVIDER_SITE_OTHER): Payer: Self-pay | Admitting: Orthopaedic Surgery

## 2017-07-24 ENCOUNTER — Ambulatory Visit (INDEPENDENT_AMBULATORY_CARE_PROVIDER_SITE_OTHER): Payer: 59 | Admitting: Orthopaedic Surgery

## 2017-07-24 ENCOUNTER — Encounter (INDEPENDENT_AMBULATORY_CARE_PROVIDER_SITE_OTHER): Payer: Self-pay | Admitting: Orthopaedic Surgery

## 2017-07-24 DIAGNOSIS — M545 Low back pain, unspecified: Secondary | ICD-10-CM

## 2017-07-24 DIAGNOSIS — G8929 Other chronic pain: Secondary | ICD-10-CM | POA: Diagnosis not present

## 2017-07-24 DIAGNOSIS — M542 Cervicalgia: Secondary | ICD-10-CM

## 2017-07-24 MED ORDER — CELECOXIB 200 MG PO CAPS: 200.0000 mg | ORAL_CAPSULE | Freq: Two times a day (BID) | ORAL | 3 refills | Status: DC

## 2017-07-24 MED ORDER — CYCLOBENZAPRINE HCL 5 MG PO TABS
5.0000 mg | ORAL_TABLET | Freq: Three times a day (TID) | ORAL | 6 refills | Status: DC | PRN
Start: 2017-07-24 — End: 2018-10-25

## 2017-07-24 NOTE — Progress Notes (Signed)
Office Visit Note   [name]           Date of Birth: May 15, 1950           MRN: [id number] Visit Date: 07/24/2017              Requested by: Gordy Savers, MD [address] [place], [place] [address] PCP: Gordy Savers, MD   Assessment & Plan: Visit Diagnoses:  1. Neck pain   2. Chronic bilateral low back pain without sciatica     Plan: Overall impression is patient has axial neck and back pain related to degenerative disc  disease and arthritis.  I discussed with the patient that surgical options would likely require fusion which would limit his flexibility and his ability to continue working in his current job.  He would like to try steroid injections with Dr. Alvester Morin to see if this will help.  Prescription for Celebrex and Flexeril.  Follow-Up Instructions: Return if symptoms worsen or fail to improve.   Orders:  Orders Placed This Encounter  Procedures  . XR Cervical Spine 2 or 3 views  . Ambulatory referral to Physical Medicine Rehab  . Ambulatory referral to Physical Medicine Rehab   Meds ordered this encounter  Medications  . cyclobenzaprine (FLEXERIL) 5 MG tablet    Sig: Take 1-2 tablets (5-10 mg total) by mouth 3 (three) times daily as needed for muscle spasms.    Dispense:  60 tablet    Refill:  6  . celecoxib (CELEBREX) 200 MG capsule    Sig: Take 1 capsule (200 mg total) by mouth 2 (two) times daily.    Dispense:  60 capsule    Refill:  3      Procedures: No procedures performed   Clinical Data: No additional findings.   Subjective: Chief Complaint  Patient presents with  . Lower Back - Follow-up    Patient comes in with worsening neck and back pain without radicular symptoms.  He states that he has pain with grinding and crepitus specially with rotation of his neck and back lateral bending.   Review of Systems  Constitutional: Negative.   All other systems reviewed and are  negative.    Objective: Vital Signs: There were no vitals taken for this visit.  Physical Exam  Constitutional: He is oriented to person, place, and time. He appears well-developed and well-nourished.  Pulmonary/Chest: Effort normal.  Abdominal: Soft.  Neurological: He is alert and oriented to person, place, and time.  Skin: Skin is warm.  Psychiatric: He has a normal mood and affect. His behavior is normal. Judgment and thought content normal.  Nursing note and vitals reviewed.   Ortho Exam Exam of his neck and back show no focal motor or sensory deficits.  No evidence of radicular symptoms. Specialty Comments:  No specialty comments available.  Imaging: Xr Cervical Spine 2 Or 3 Views  Result Date: 07/24/2017 Multilevel degenerative disc disease and cervical spondylosis.  Xr Lumbar Spine 2-3 Views  Result Date: 07/24/2017 Severe degenerative disc disease at L5-S1 and L4-L5.  Lumbar spondylosis.    PMFS History: Patient Active Problem List   Diagnosis Date Noted  . Cervicalgia 05/30/2016  . Dyslipidemia 03/15/2012  . Tobacco abuse 03/15/2012  . Osteoarthritis 09/19/2011   Past Medical History:  Diagnosis Date  . Allergy   . History of chicken pox     Family History  Problem Relation Age of Onset  . Heart disease Mother   .  Cancer Father        lung    Past Surgical History:  Procedure Laterality Date  . thumb surgery     Social History   Occupational History  . Not on file  Tobacco Use  . Smoking status: Current Every Day Smoker    Packs/day: 1.00    Types: Cigarettes  . Smokeless tobacco: Never Used  Substance and Sexual Activity  . Alcohol use: Yes    Comment: rarley  . Drug use: No  . Sexual activity: Not on file

## 2017-08-13 ENCOUNTER — Ambulatory Visit (INDEPENDENT_AMBULATORY_CARE_PROVIDER_SITE_OTHER): Payer: 59 | Admitting: Physical Medicine and Rehabilitation

## 2017-08-13 ENCOUNTER — Ambulatory Visit (INDEPENDENT_AMBULATORY_CARE_PROVIDER_SITE_OTHER): Payer: 59

## 2017-08-13 ENCOUNTER — Encounter (INDEPENDENT_AMBULATORY_CARE_PROVIDER_SITE_OTHER): Payer: Self-pay | Admitting: Physical Medicine and Rehabilitation

## 2017-08-13 VITALS — BP 135/102 | HR 75

## 2017-08-13 DIAGNOSIS — M47812 Spondylosis without myelopathy or radiculopathy, cervical region: Secondary | ICD-10-CM | POA: Diagnosis not present

## 2017-08-13 MED ORDER — METHYLPREDNISOLONE ACETATE 80 MG/ML IJ SUSP
80.0000 mg | Freq: Once | INTRAMUSCULAR | Status: AC
  Administered 2017-08-13: 80 mg

## 2017-08-13 NOTE — Progress Notes (Signed)
[facility] - 67 y.o. male MRN [id number]  Date of birth: 1951/03/17  Office Visit Note: Visit Date: 08/13/2017 PCP: Gordy Savers, MD Referred by: Gordy Savers, MD  Subjective: Chief Complaint  Patient presents with  . Neck - Pain  . Right Arm - Pain  . Left Arm - Pain   HPI: Rodney Daniels comes in today at the request of Dr. Roda Shutters for cervical interventional injection.  He is a 67 year old gentleman with chronic worsening left-sided neck pain about 80% on the left 20% on the right worse with left rotation.  He gets some referral in the shoulders but that is rare.  This is been going on for quite some time.  Movement does make his neck worse and hear some popping and cracking.  He has no radicular complaints.  MRI of the cervical spine is reviewed below with mainly bilateral facet arthropathy and some foraminal narrowing but no central canal stenosis.  We are going to complete diagnostic and hopefully therapeutic left-sided C4-5 and C5-6 facet joint blocks.   ROS Otherwise per HPI.  Assessment & Plan: Visit Diagnoses:  1. Cervical spondylosis without myelopathy     Plan: No additional findings.   Meds & Orders:  Meds ordered this encounter  Medications  . methylPREDNISolone acetate (DEPO-MEDROL) injection 80 mg    Orders Placed This Encounter  Procedures  . Facet Injection  . XR C-ARM NO REPORT    Follow-up: Return if symptoms worsen or fail to improve.   Procedures: No procedures performed  Cervical Facet Joint Intra-Articular Injection with Fluoroscopic Guidance  [name]      Date of Birth: 02-Aug-1950 MRN: [id number] PCP: Gordy Savers, MD      Visit Date: 08/13/2017   Universal Protocol:    Date/Time: 08/14/1910:11 PM  Consent Given By: the patient  Position: PRONE  Additional Comments: Vital signs were monitored before and after the procedure. Patient was prepped and draped in the usual sterile fashion. The  correct patient, procedure, and site was verified.   Injection Procedure Details:  Procedure Site One Meds Administered:  Meds ordered this encounter  Medications  . methylPREDNISolone acetate (DEPO-MEDROL) injection 80 mg     Laterality: Left  Location/Site:  C4-5 C5-6  Needle size: 25 G  Needle type: Spinal  Needle Placement: Articular  Findings:  -Contrast Used: 0.5 mL iohexol 180 mg iodine/mL   -Comments: Excellent flow of contrast producing a partial arthrogram.  Procedure Details: The region overlying the facet joints mentioned above were localized under fluoroscopic visualization. The needle was inserted down to the level of the lateral mass of the superior articular process of the facet joint to be injected. Then, the needle was "walked off" inferiorly into the lateral aspect of the facet joint. Bi-planar images were used for confirming placement and spot radiographs were documented.  A 0.25 ml volume of Omnipaque-240 was injected into the facet joint and a standard partial arthrogram was obtained. Radiographs were obtained of the arthrogram. A 0.5 ml. volume of the steroid/anesthetic solution was injected into the joint. This procedure was repeated for each facet joint injected.   Additional Comments:  The patient tolerated the procedure well Dressing: Band-Aid    Post-procedure details: Patient was observed during the procedure. Post-procedure instructions were reviewed.  Patient left the clinic in stable condition.   Clinical History: MRI CERVICAL SPINE WITHOUT CONTRAST  TECHNIQUE: Multiplanar, multisequence MR imaging of the cervical spine was performed. No intravenous contrast  was administered.  COMPARISON:  MRI of the cervical spine 08/16/2005  FINDINGS: Alignment: Sight retrolisthesis at C5-6 and C6-7 is stable. Slight degenerative retrolisthesis is now present at C3-4. AP alignment is otherwise anatomic.  Vertebrae: Vertebral body heights  are preserved. Marrow signal is slightly heterogeneous with fatty infiltration. No discrete lesions are present.  Cord: Normal signal is present in the cervical and upper thoracic spinal cord to the lowest imaged level, T2-3.  Posterior Fossa, vertebral arteries, paraspinal tissues: The craniocervical junction is normal. Flow is present in the vertebral arteries bilaterally. The visualized posterior fossa is unremarkable.  Disc levels:  C2-3:  Negative.  C3-4: Progressive right-sided uncovertebral and facet spurring leads to moderate right foraminal narrowing. The central canal and left foramen are patent.  C4-5: Progressive uncovertebral and facet disease lead to mild foraminal narrowing, right greater than left.  C5-6: A broad-based disc osteophyte complex is asymmetric to left. Severe left and moderate right foraminal narrowing have progressed.  C6-7: A broad-based disc osteophyte complex is present. Uncovertebral spurring leads to progressive moderate foraminal narrowing bilaterally, left greater than right.  C7-T1:  Negative.  IMPRESSION: 1. Progressive multilevel spondylosis of the cervical spine as described. 2. New moderate right foraminal narrowing at C3-4 secondary to progressive right-sided uncovertebral and facet disease. 3. Mild foraminal narrowing bilaterally at C4-5 is worse on the right. 4. Progressive severe left and moderate right foraminal narrowing at C5-6. 5. Progressive moderate foraminal narrowing bilaterally at C6-7 is worse on the left.   Electronically Signed   By: Marin Roberts M.D.   On: 09/08/2016 15:24   He reports that he has been smoking cigarettes.  He has been smoking about 1.00 pack per day. He has never used smokeless tobacco. No results for input(s): HGBA1C, LABURIC in the last 8760 hours.  Objective:  VS:  HT:    WT:   BMI:     BP:(!) 135/102  HR:75bpm  TEMP: ( )  RESP:  Physical Exam  Musculoskeletal:   Patient has limited range of motion with left rotation and extension.  He has a negative Spurling's test.  He has good strength in the upper extremities.    Ortho Exam Imaging: Xr C-arm No Report  Result Date: 08/13/2017 Please see Notes or Procedures tab for imaging impression.   Past Medical/Family/Surgical/Social History: Medications & Allergies reviewed per EMR, new medications updated. Patient Active Problem List   Diagnosis Date Noted  . Cervicalgia 05/30/2016  . Dyslipidemia 03/15/2012  . Tobacco abuse 03/15/2012  . Osteoarthritis 09/19/2011   Past Medical History:  Diagnosis Date  . Allergy   . History of chicken pox    Family History  Problem Relation Age of Onset  . Heart disease Mother   . Cancer Father        lung   Past Surgical History:  Procedure Laterality Date  . thumb surgery     Social History   Occupational History  . Not on file  Tobacco Use  . Smoking status: Current Every Day Smoker    Packs/day: 1.00    Types: Cigarettes  . Smokeless tobacco: Never Used  Substance and Sexual Activity  . Alcohol use: Yes    Comment: rarley  . Drug use: No  . Sexual activity: Not on file

## 2017-08-13 NOTE — Progress Notes (Signed)
 .  Numeric Pain Rating Scale and Functional Assessment Average Pain 6   In the last MONTH (on 0-10 scale) has pain interfered with the following?  1. General activity like being  able to carry out your everyday physical activities such as walking, climbing stairs, carrying groceries, or moving a chair?  Rating(3)   +Driver, -BT, -Dye Allergies.  

## 2017-08-13 NOTE — Patient Instructions (Signed)

## 2017-08-13 NOTE — Procedures (Signed)
Cervical Facet Joint Intra-Articular Injection with Fluoroscopic Guidance  [name]      Date of Birth: December 30, 1950 MRN: [id number] PCP: Gordy Savers, MD      Visit Date: 08/13/2017   Universal Protocol:    Date/Time: 08/14/1910:11 PM  Consent Given By: the patient  Position: PRONE  Additional Comments: Vital signs were monitored before and after the procedure. Patient was prepped and draped in the usual sterile fashion. The correct patient, procedure, and site was verified.   Injection Procedure Details:  Procedure Site One Meds Administered:  Meds ordered this encounter  Medications  . methylPREDNISolone acetate (DEPO-MEDROL) injection 80 mg     Laterality: Left  Location/Site:  C4-5 C5-6  Needle size: 25 G  Needle type: Spinal  Needle Placement: Articular  Findings:  -Contrast Used: 0.5 mL iohexol 180 mg iodine/mL   -Comments: Excellent flow of contrast producing a partial arthrogram.  Procedure Details: The region overlying the facet joints mentioned above were localized under fluoroscopic visualization. The needle was inserted down to the level of the lateral mass of the superior articular process of the facet joint to be injected. Then, the needle was "walked off" inferiorly into the lateral aspect of the facet joint. Bi-planar images were used for confirming placement and spot radiographs were documented.  A 0.25 ml volume of Omnipaque-240 was injected into the facet joint and a standard partial arthrogram was obtained. Radiographs were obtained of the arthrogram. A 0.5 ml. volume of the steroid/anesthetic solution was injected into the joint. This procedure was repeated for each facet joint injected.   Additional Comments:  The patient tolerated the procedure well Dressing: Band-Aid    Post-procedure details: Patient was observed during the procedure. Post-procedure instructions were reviewed.  Patient left the clinic in  stable condition.

## 2017-08-27 ENCOUNTER — Ambulatory Visit (INDEPENDENT_AMBULATORY_CARE_PROVIDER_SITE_OTHER): Payer: 59

## 2017-08-27 ENCOUNTER — Ambulatory Visit (INDEPENDENT_AMBULATORY_CARE_PROVIDER_SITE_OTHER): Payer: 59 | Admitting: Physical Medicine and Rehabilitation

## 2017-08-27 ENCOUNTER — Encounter (INDEPENDENT_AMBULATORY_CARE_PROVIDER_SITE_OTHER): Payer: Self-pay | Admitting: Physical Medicine and Rehabilitation

## 2017-08-27 VITALS — BP 124/88 | HR 75

## 2017-08-27 DIAGNOSIS — G8929 Other chronic pain: Secondary | ICD-10-CM

## 2017-08-27 DIAGNOSIS — M5442 Lumbago with sciatica, left side: Secondary | ICD-10-CM | POA: Diagnosis not present

## 2017-08-27 DIAGNOSIS — M5416 Radiculopathy, lumbar region: Secondary | ICD-10-CM | POA: Diagnosis not present

## 2017-08-27 DIAGNOSIS — M5441 Lumbago with sciatica, right side: Secondary | ICD-10-CM | POA: Diagnosis not present

## 2017-08-27 DIAGNOSIS — M47816 Spondylosis without myelopathy or radiculopathy, lumbar region: Secondary | ICD-10-CM | POA: Diagnosis not present

## 2017-08-27 MED ORDER — METHYLPREDNISOLONE ACETATE 80 MG/ML IJ SUSP
80.0000 mg | Freq: Once | INTRAMUSCULAR | Status: AC
  Administered 2017-08-27: 80 mg

## 2017-08-27 NOTE — Patient Instructions (Signed)

## 2017-08-27 NOTE — Progress Notes (Signed)
Rodney IraniDavid M [facility] - 67 y.o. male MRN [id number]  Date of birth: 02/14/1951  Office Visit Note: Visit Date: 08/27/2017 PCP: Gordy SaversKwiatkowski, Peter F, MD Referred by: Gordy SaversKwiatkowski, Peter F, MD  Subjective: Chief Complaint  Patient presents with  . Lower Back - Pain  . Right Leg - Pain  . Left Leg - Pain   HPI: Rodney Daniels is a 67 year old gentleman that is followed by Dr. Glee ArvinMichael Xu in our office for his orthopedic care.  I seen him on 2 occasions for cervicalgia and cervical arthritis.  He is also been complaining of chronic low back pain with some referral into both legs at times.  He is failed conservative care including therapy and medication management.  Dr. Roda ShuttersXu suggested lumbar epidural injection diagnostically and therapeutically.  He reports this started a couple years ago with no specific injury.  He says moving and working makes it worse and nothing really has made it much better.  He rates his overall pain is 6 out of 10.  He has not had prior injections or surgery.  Lumbar spine MRI shows degenerative disc disease with straightening of the normal lordosis with facet arthropathy of the lower 2 segments.  We will complete a diagnostic and hopefully therapeutic right L5-S1 intralaminar epidural steroid injection.  Depending on relief would suggest MRI of the lumbar spine.   ROS Otherwise per HPI.  Assessment & Plan: Visit Diagnoses:  1. Chronic bilateral low back pain with bilateral sciatica   2. Lumbar radiculopathy   3. Spondylosis without myelopathy or radiculopathy, lumbar region     Plan: No additional findings.   Meds & Orders:  Meds ordered this encounter  Medications  . methylPREDNISolone acetate (DEPO-MEDROL) injection 80 mg    Orders Placed This Encounter  Procedures  . XR C-ARM NO REPORT  . Epidural Steroid injection    Follow-up: Return if symptoms worsen or fail to improve.   Procedures: No procedures performed  Lumbar Epidural Steroid Injection -  Interlaminar Approach with Fluoroscopic Guidance  Patient: Rodney IraniDavid M [facility]iss-Roe      Date of Birth: 02/14/1951 MRN: [id number] PCP: Gordy SaversKwiatkowski, Peter F, MD      Visit Date: 08/27/2017   Universal Protocol:     Consent Given By: the patient  Position: PRONE  Additional Comments: Vital signs were monitored before and after the procedure. Patient was prepped and draped in the usual sterile fashion. The correct patient, procedure, and site was verified.   Injection Procedure Details:  Procedure Site One Meds Administered:  Meds ordered this encounter  Medications  . methylPREDNISolone acetate (DEPO-MEDROL) injection 80 mg     Laterality: Right  Location/Site:  L5-S1  Needle size: 20 G  Needle type: Tuohy  Needle Placement: Paramedian epidural  Findings:   -Comments: Excellent flow of contrast into the epidural space.  Procedure Details: Using a paramedian approach from the side mentioned above, the region overlying the inferior lamina was localized under fluoroscopic visualization and the soft tissues overlying this structure were infiltrated with 4 ml. of 1% Lidocaine without Epinephrine. The Tuohy needle was inserted into the epidural space using a paramedian approach.   The epidural space was localized using loss of resistance along with lateral and bi-planar fluoroscopic views.  After negative aspirate for air, blood, and CSF, a 2 ml. volume of Isovue-250 was injected into the epidural space and the flow of contrast was observed. Radiographs were obtained for documentation purposes.    The injectate was administered into the level  noted above.   Additional Comments:  The patient tolerated the procedure well Dressing: Band-Aid    Post-procedure details: Patient was observed during the procedure. Post-procedure instructions were reviewed.  Patient left the clinic in stable condition.   Clinical History: MRI CERVICAL SPINE WITHOUT  CONTRAST  TECHNIQUE: Multiplanar, multisequence MR imaging of the cervical spine was performed. No intravenous contrast was administered.  COMPARISON:  MRI of the cervical spine 08/16/2005  FINDINGS: Alignment: Sight retrolisthesis at C5-6 and C6-7 is stable. Slight degenerative retrolisthesis is now present at C3-4. AP alignment is otherwise anatomic.  Vertebrae: Vertebral body heights are preserved. Marrow signal is slightly heterogeneous with fatty infiltration. No discrete lesions are present.  Cord: Normal signal is present in the cervical and upper thoracic spinal cord to the lowest imaged level, T2-3.  Posterior Fossa, vertebral arteries, paraspinal tissues: The craniocervical junction is normal. Flow is present in the vertebral arteries bilaterally. The visualized posterior fossa is unremarkable.  Disc levels:  C2-3:  Negative.  C3-4: Progressive right-sided uncovertebral and facet spurring leads to moderate right foraminal narrowing. The central canal and left foramen are patent.  C4-5: Progressive uncovertebral and facet disease lead to mild foraminal narrowing, right greater than left.  C5-6: A broad-based disc osteophyte complex is asymmetric to left. Severe left and moderate right foraminal narrowing have progressed.  C6-7: A broad-based disc osteophyte complex is present. Uncovertebral spurring leads to progressive moderate foraminal narrowing bilaterally, left greater than right.  C7-T1:  Negative.  IMPRESSION: 1. Progressive multilevel spondylosis of the cervical spine as described. 2. New moderate right foraminal narrowing at C3-4 secondary to progressive right-sided uncovertebral and facet disease. 3. Mild foraminal narrowing bilaterally at C4-5 is worse on the right. 4. Progressive severe left and moderate right foraminal narrowing at C5-6. 5. Progressive moderate foraminal narrowing bilaterally at C6-7 is worse on the  left.   Electronically Signed   By: Marin Roberts M.D.   On: 09/08/2016 15:24   He reports that he has been smoking cigarettes.  He has been smoking about 1.00 pack per day. He has never used smokeless tobacco. No results for input(s): HGBA1C, LABURIC in the last 8760 hours.  Objective:  VS:  HT:    WT:   BMI:     BP:124/88  HR:75bpm  TEMP: ( )  RESP:  Physical Exam  Ortho Exam Imaging: Xr C-arm No Report  Result Date: 08/27/2017 Please see Notes or Procedures tab for imaging impression.   Past Medical/Family/Surgical/Social History: Medications & Allergies reviewed per EMR, new medications updated. Patient Active Problem List   Diagnosis Date Noted  . Cervicalgia 05/30/2016  . Dyslipidemia 03/15/2012  . Tobacco abuse 03/15/2012  . Osteoarthritis 09/19/2011   Past Medical History:  Diagnosis Date  . Allergy   . History of chicken pox    Family History  Problem Relation Age of Onset  . Heart disease Mother   . Cancer Father        lung   Past Surgical History:  Procedure Laterality Date  . thumb surgery     Social History   Occupational History  . Not on file  Tobacco Use  . Smoking status: Current Every Day Smoker    Packs/day: 1.00    Types: Cigarettes  . Smokeless tobacco: Never Used  Substance and Sexual Activity  . Alcohol use: Yes    Comment: rarley  . Drug use: No  . Sexual activity: Not on file

## 2017-08-27 NOTE — Progress Notes (Signed)
 .  Numeric Pain Rating Scale and Functional Assessment Average Pain 6   In the last MONTH (on 0-10 scale) has pain interfered with the following?  1. General activity like being  able to carry out your everyday physical activities such as walking, climbing stairs, carrying groceries, or moving a chair?  Rating(4)   +Driver, -BT, -Dye Allergies.  

## 2017-08-28 NOTE — Procedures (Signed)
Lumbar Epidural Steroid Injection - Interlaminar Approach with Fluoroscopic Guidance  Patient: Rodney IraniDavid M Daniels      Date of Birth: [birth year] MRN: [id number] PCP: Gordy SaversKwiatkowski, Peter F, MD      Visit Date: 08/27/2017   Universal Protocol:     Consent Given By: the patient  Position: PRONE  Additional Comments: Vital signs were monitored before and after the procedure. Patient was prepped and draped in the usual sterile fashion. The correct patient, procedure, and site was verified.   Injection Procedure Details:  Procedure Site One Meds Administered:  Meds ordered this encounter  Medications  . methylPREDNISolone acetate (DEPO-MEDROL) injection 80 mg     Laterality: Right  Location/Site:  L5-S1  Needle size: 20 G  Needle type: Tuohy  Needle Placement: Paramedian epidural  Findings:   -Comments: Excellent flow of contrast into the epidural space.  Procedure Details: Using a paramedian approach from the side mentioned above, the region overlying the inferior lamina was localized under fluoroscopic visualization and the soft tissues overlying this structure were infiltrated with 4 ml. of 1% Lidocaine without Epinephrine. The Tuohy needle was inserted into the epidural space using a paramedian approach.   The epidural space was localized using loss of resistance along with lateral and bi-planar fluoroscopic views.  After negative aspirate for air, blood, and CSF, a 2 ml. volume of Isovue-250 was injected into the epidural space and the flow of contrast was observed. Radiographs were obtained for documentation purposes.    The injectate was administered into the level noted above.   Additional Comments:  The patient tolerated the procedure well Dressing: Band-Aid    Post-procedure details: Patient was observed during the procedure. Post-procedure instructions were reviewed.  Patient left the clinic in stable condition.

## 2018-10-25 ENCOUNTER — Ambulatory Visit: Payer: 59 | Admitting: Physician Assistant

## 2018-10-25 ENCOUNTER — Encounter: Payer: Self-pay | Admitting: Physician Assistant

## 2018-10-25 ENCOUNTER — Other Ambulatory Visit: Payer: Self-pay

## 2018-10-25 VITALS — BP 138/84 | HR 80 | Temp 98.0°F | Resp 16 | Ht 73.0 in | Wt 175.0 lb

## 2018-10-25 DIAGNOSIS — Z720 Tobacco use: Secondary | ICD-10-CM

## 2018-10-25 DIAGNOSIS — R0789 Other chest pain: Secondary | ICD-10-CM | POA: Diagnosis not present

## 2018-10-25 DIAGNOSIS — Z1211 Encounter for screening for malignant neoplasm of colon: Secondary | ICD-10-CM

## 2018-10-25 DIAGNOSIS — E785 Hyperlipidemia, unspecified: Secondary | ICD-10-CM | POA: Diagnosis not present

## 2018-10-25 DIAGNOSIS — Z125 Encounter for screening for malignant neoplasm of prostate: Secondary | ICD-10-CM | POA: Diagnosis not present

## 2018-10-25 DIAGNOSIS — Z122 Encounter for screening for malignant neoplasm of respiratory organs: Secondary | ICD-10-CM

## 2018-10-25 DIAGNOSIS — Z299 Encounter for prophylactic measures, unspecified: Secondary | ICD-10-CM

## 2018-10-25 LAB — COMPREHENSIVE METABOLIC PANEL
ALT: 10 U/L (ref 0–53)
AST: 13 U/L (ref 0–37)
Albumin: 4.3 g/dL (ref 3.5–5.2)
Alkaline Phosphatase: 88 U/L (ref 39–117)
BUN: 14 mg/dL (ref 6–23)
CO2: 26 mEq/L (ref 19–32)
Calcium: 9.3 mg/dL (ref 8.4–10.5)
Chloride: 107 mEq/L (ref 96–112)
Creatinine, Ser: 1.01 mg/dL (ref 0.40–1.50)
GFR: 73.48 mL/min (ref >60.00)
Glucose, Bld: 87 mg/dL (ref 70–99)
Potassium: 4.5 mEq/L (ref 3.5–5.1)
Sodium: 140 mEq/L (ref 135–145)
Total Bilirubin: 1.1 mg/dL (ref 0.2–1.2)
Total Protein: 6.3 g/dL (ref 6.0–8.3)

## 2018-10-25 LAB — CBC WITH DIFFERENTIAL/PLATELET
Basophils Absolute: 0.1 10*3/uL (ref 0.0–0.1)
Basophils Relative: 1.3 % (ref 0.0–3.0)
Eosinophils Absolute: 0.4 10*3/uL (ref 0.0–0.7)
Eosinophils Relative: 5.1 % — ABNORMAL HIGH (ref 0.0–5.0)
HCT: 47.4 % (ref 39.0–52.0)
Hemoglobin: 16 g/dL (ref 13.0–17.0)
Lymphocytes Relative: 32.2 % (ref 12.0–46.0)
Lymphs Abs: 2.3 10*3/uL (ref 0.7–4.0)
MCHC: 33.9 g/dL (ref 30.0–36.0)
MCV: 89.7 fl (ref 78.0–100.0)
Monocytes Absolute: 0.5 10*3/uL (ref 0.1–1.0)
Monocytes Relative: 7.1 % (ref 3.0–12.0)
Neutro Abs: 4 10*3/uL (ref 1.4–7.7)
Neutrophils Relative %: 54.3 % (ref 43.0–77.0)
Platelets: 204 10*3/uL (ref 150.0–400.0)
RBC: 5.28 Mil/uL (ref 4.22–5.81)
RDW: 13 % (ref 11.5–15.5)
WBC: 7.3 10*3/uL (ref 4.0–10.5)

## 2018-10-25 LAB — LIPID PANEL
Cholesterol: 239 mg/dL — ABNORMAL HIGH (ref 0–200)
HDL: 40.6 mg/dL (ref >39.00)
LDL Cholesterol: 175 mg/dL — ABNORMAL HIGH (ref 0–99)
NonHDL: 198.44
Total CHOL/HDL Ratio: 6
Triglycerides: 119 mg/dL (ref 0.0–149.0)
VLDL: 23.8 mg/dL (ref 0.0–40.0)

## 2018-10-25 LAB — HEMOGLOBIN A1C: Hgb A1c MFr Bld: 5.5 % (ref 4.6–6.5)

## 2018-10-25 LAB — PSA: PSA: 1.07 ng/mL (ref 0.10–4.00)

## 2018-10-25 NOTE — Progress Notes (Signed)
Patient presents to clinic today to establish care. Patient is a former Dr. Raliegh Ip patient (last seen in 2014). Since that time has not seen any other primary providers. Is fasting for labs.  Acute Concerns: Patient endorses 2-3 months of aching or stabbing chest pain lasting 1-2 minutes. Notes is central or L-sided, non-radiating. Happens at rest or exertion. Denies racing heart, SOB, lightheadedness or dizziness. Denies neck pain.  Denies trauma or injury. Cough in the mornings -- wheezing. Denies heartburn or indigestion. Denies abdominal pain or nausea. Denies history of hypertension. Is a smoker with aa 50 pack-year history. No prior EKG on file.   Chronic Issues: Tobacco Use Disorder -- > 50 pack-year smoking history. Current smoker. Denies issues with SOB, wheezing. Has daily cough, sometimes productive in the morning. Has been this way without change for > 10 years. Denies prior lung cancer screening or PFTs.  Health Maintenance: Immunizations -- Tetanus UTD. Declines Pneumonia vaccine. Colon Cancer Screening -- Agrees to Cologuard. Average risk. Asymptomatic.  Past Medical History:  Diagnosis Date  . Allergy   . History of chicken pox     Past Surgical History:  Procedure Laterality Date  . thumb surgery      No current outpatient medications on file prior to visit.   No current facility-administered medications on file prior to visit.     No Known Allergies  Family History  Problem Relation Age of Onset  . Heart disease Mother   . Cancer Father        lung    Social History   Socioeconomic History  . Marital status: Married    Spouse name: Not on file  . Number of children: Not on file  . Years of education: Not on file  . Highest education level: Not on file  Occupational History  . Not on file  Social Needs  . Financial resource strain: Not on file  . Food insecurity    Worry: Not on file    Inability: Not on file  . Transportation needs    Medical:  Not on file    Non-medical: Not on file  Tobacco Use  . Smoking status: Current Every Day Smoker    Packs/day: 1.00    Years: 55.00    Pack years: 55.00    Types: Cigarettes  . Smokeless tobacco: Never Used  Substance and Sexual Activity  . Alcohol use: Yes    Comment: rarely  . Drug use: No  . Sexual activity: Not Currently  Lifestyle  . Physical activity    Days per week: Not on file    Minutes per session: Not on file  . Stress: Not on file  Relationships  . Social Herbalist on phone: Not on file    Gets together: Not on file    Attends religious service: Not on file    Active member of club or organization: Not on file    Attends meetings of clubs or organizations: Not on file    Relationship status: Not on file  . Intimate partner violence    Fear of current or ex partner: Not on file    Emotionally abused: Not on file    Physically abused: Not on file    Forced sexual activity: Not on file  Other Topics Concern  . Not on file  Social History Narrative  . Not on file    Review of Systems  Constitutional: Negative for fever and weight loss.  HENT:  Negative for ear discharge, ear pain, hearing loss and tinnitus.   Eyes: Negative for blurred vision, double vision, photophobia and pain.  Respiratory: Negative for cough and shortness of breath.   Cardiovascular: Positive for chest pain. Negative for palpitations.  Gastrointestinal: Negative for abdominal pain, blood in stool, constipation, diarrhea, heartburn, melena, nausea and vomiting.  Genitourinary: Negative for dysuria, flank pain, frequency, hematuria and urgency.  Musculoskeletal: Negative for falls.  Neurological: Negative for dizziness, loss of consciousness and headaches.  Endo/Heme/Allergies: Negative for environmental allergies.  Psychiatric/Behavioral: Negative for depression, hallucinations, substance abuse and suicidal ideas. The patient is not nervous/anxious and does not have insomnia.     BP 138/84   Pulse 80   Temp 98 F (36.7 C) (Skin)   Resp 16   Ht 6\' 1"  (1.854 m)   Wt 175 lb (79.4 kg)   SpO2 98%   BMI 23.09 kg/m   Physical Exam Vitals signs reviewed.  Constitutional:      General: He is not in acute distress.    Appearance: He is well-developed. He is not diaphoretic.  HENT:     Head: Normocephalic and atraumatic.     Right Ear: Tympanic membrane, ear canal and external ear normal.     Left Ear: Tympanic membrane, ear canal and external ear normal.     Nose: Nose normal.     Mouth/Throat:     Pharynx: No posterior oropharyngeal erythema.  Eyes:     Conjunctiva/sclera: Conjunctivae normal.     Pupils: Pupils are equal, round, and reactive to light.  Neck:     Musculoskeletal: Neck supple.     Thyroid: No thyromegaly.  Cardiovascular:     Rate and Rhythm: Normal rate and regular rhythm.     Heart sounds: Normal heart sounds.  Pulmonary:     Effort: Pulmonary effort is normal. No respiratory distress.     Breath sounds: Normal breath sounds. No wheezing or rales.  Chest:     Chest wall: No tenderness.  Abdominal:     General: Bowel sounds are normal. There is no distension.     Palpations: Abdomen is soft. There is no mass.     Tenderness: There is no abdominal tenderness. There is no guarding or rebound.  Lymphadenopathy:     Cervical: No cervical adenopathy.  Skin:    General: Skin is warm and dry.     Findings: No rash.  Neurological:     Mental Status: He is alert and oriented to person, place, and time.     Cranial Nerves: No cranial nerve deficit.    Assessment/Plan: 1. Atypical chest pain Asymptomatic at present. Examination unremarkable.  Bp improved on recheck. EKG today with NSR but shows potential old anterior infarct. Do not see true evidence of this on EKG. Will obtain stress testing. Will also obtain fasting labs today to further risk stratify. CT Chest ordered for lung ca screen and to rule out pulmonary cause of symptoms.  - EKG  12-Lead - CBC with Differential/Platelet - Comprehensive metabolic panel - Hemoglobin A1c - Lipid panel - Exercise Tolerance Test; Future  2. Dyslipidemia Prior history noted in chart. Repeat fasting labs today. Dietary and exercise recommendations reviewed. - CBC with Differential/Platelet - Comprehensive metabolic panel - Hemoglobin A1c - Lipid panel  3. Tobacco abuse Notes wanting to quit but denies readiness today. Checking low-dose CT to screen for lung CA. Discussed cessation options that he will give some thought to.  - CBC with Differential/Platelet -  Comprehensive metabolic panel - Hemoglobin A1c - Lipid panel  4. Prostate cancer screening The natural history of prostate cancer and ongoing controversy regarding screening and potential treatment outcomes of prostate cancer has been discussed with the patient. The meaning of a false positive PSA and a false negative PSA has been discussed. He indicates understanding of the limitations of this screening test and wishes to proceed with screening PSA testing.  - PSA  5. Colon cancer screening Average risk. Asymptomatic.Agrees to Cologuard screening. Order placed.  - Cologuard  6. Encounter for screening for lung cancer > 50 pack-year smoking history. Will obtain low dose CT chest for screening.  - CT CHEST LUNG CA SCREEN LOW DOSE W/O CM; Future  7. Preventive measure Needs Hep panel for work.  - Acute Hep Panel & Hep B Surface Ab   Piedad ClimesWilliam Cody Fredi Geiler, PA-C

## 2018-10-25 NOTE — Patient Instructions (Signed)
Please go to the lab today for blood work.  I will call you with your results. We will alter treatment regimen(s) if indicated by your results.   You will be contacted to schedule a stress test, lung cancer screen (CT). You will also receive a Cologuard test in the mail. Please complete and send back in to company within 2 weeks. Put the box in the bathroom so it will be in sight to remind you to complete.   If you note any chest pain with exertion that is not improving with rest, you need to have an ER evaluation.   Follow the diet below to help with keeping blood pressure stable.    DASH Eating Plan DASH stands for "Dietary Approaches to Stop Hypertension." The DASH eating plan is a healthy eating plan that has been shown to reduce high blood pressure (hypertension). It may also reduce your risk for type 2 diabetes, heart disease, and stroke. The DASH eating plan may also help with weight loss. What are tips for following this plan?  General guidelines  Avoid eating more than 2,300 mg (milligrams) of salt (sodium) a day. If you have hypertension, you may need to reduce your sodium intake to 1,500 mg a day.  Limit alcohol intake to no more than 1 drink a day for nonpregnant women and 2 drinks a day for men. One drink equals 12 oz of beer, 5 oz of wine, or 1 oz of hard liquor.  Work with your health care provider to maintain a healthy body weight or to lose weight. Ask what an ideal weight is for you.  Get at least 30 minutes of exercise that causes your heart to beat faster (aerobic exercise) most days of the week. Activities may include walking, swimming, or biking.  Work with your health care provider or diet and nutrition specialist (dietitian) to adjust your eating plan to your individual calorie needs. Reading food labels   Check food labels for the amount of sodium per serving. Choose foods with less than 5 percent of the Daily Value of sodium. Generally, foods with less than 300  mg of sodium per serving fit into this eating plan.  To find whole grains, look for the word "whole" as the first word in the ingredient list. Shopping  Buy products labeled as "low-sodium" or "no salt added."  Buy fresh foods. Avoid canned foods and premade or frozen meals. Cooking  Avoid adding salt when cooking. Use salt-free seasonings or herbs instead of table salt or sea salt. Check with your health care provider or pharmacist before using salt substitutes.  Do not fry foods. Cook foods using healthy methods such as baking, boiling, grilling, and broiling instead.  Cook with heart-healthy oils, such as olive, canola, soybean, or sunflower oil. Meal planning  Eat a balanced diet that includes: ? 5 or more servings of fruits and vegetables each day. At each meal, try to fill half of your plate with fruits and vegetables. ? Up to 6-8 servings of whole grains each day. ? Less than 6 oz of lean meat, poultry, or fish each day. A 3-oz serving of meat is about the same size as a deck of cards. One egg equals 1 oz. ? 2 servings of low-fat dairy each day. ? A serving of nuts, seeds, or beans 5 times each week. ? Heart-healthy fats. Healthy fats called Omega-3 fatty acids are found in foods such as flaxseeds and coldwater fish, like sardines, salmon, and mackerel.  Limit  how much you eat of the following: ? Canned or prepackaged foods. ? Food that is high in trans fat, such as fried foods. ? Food that is high in saturated fat, such as fatty meat. ? Sweets, desserts, sugary drinks, and other foods with added sugar. ? Full-fat dairy products.  Do not salt foods before eating.  Try to eat at least 2 vegetarian meals each week.  Eat more home-cooked food and less restaurant, buffet, and fast food.  When eating at a restaurant, ask that your food be prepared with less salt or no salt, if possible. What foods are recommended? The items listed may not be a complete list. Talk with your  dietitian about what dietary choices are best for you. Grains Whole-grain or whole-wheat bread. Whole-grain or whole-wheat pasta. Brown rice. Modena Morrow. Bulgur. Whole-grain and low-sodium cereals. Pita bread. Low-fat, low-sodium crackers. Whole-wheat flour tortillas. Vegetables Fresh or frozen vegetables (raw, steamed, roasted, or grilled). Low-sodium or reduced-sodium tomato and vegetable juice. Low-sodium or reduced-sodium tomato sauce and tomato paste. Low-sodium or reduced-sodium canned vegetables. Fruits All fresh, dried, or frozen fruit. Canned fruit in natural juice (without added sugar). Meat and other protein foods Skinless chicken or [place]. Ground chicken or [place]. Pork with fat trimmed off. Fish and seafood. Egg whites. Dried beans, peas, or lentils. Unsalted nuts, nut butters, and seeds. Unsalted canned beans. Lean cuts of beef with fat trimmed off. Low-sodium, lean deli meat. Dairy Low-fat (1%) or fat-free (skim) milk. Fat-free, low-fat, or reduced-fat cheeses. Nonfat, low-sodium ricotta or cottage cheese. Low-fat or nonfat yogurt. Low-fat, low-sodium cheese. Fats and oils Soft margarine without trans fats. Vegetable oil. Low-fat, reduced-fat, or light mayonnaise and salad dressings (reduced-sodium). Canola, safflower, olive, soybean, and sunflower oils. Avocado. Seasoning and other foods Herbs. Spices. Seasoning mixes without salt. Unsalted popcorn and pretzels. Fat-free sweets. What foods are not recommended? The items listed may not be a complete list. Talk with your dietitian about what dietary choices are best for you. Grains Baked goods made with fat, such as croissants, muffins, or some breads. Dry pasta or rice meal packs. Vegetables Creamed or fried vegetables. Vegetables in a cheese sauce. Regular canned vegetables (not low-sodium or reduced-sodium). Regular canned tomato sauce and paste (not low-sodium or reduced-sodium). Regular tomato and vegetable juice (not  low-sodium or reduced-sodium). Angie Fava. Olives. Fruits Canned fruit in a light or heavy syrup. Fried fruit. Fruit in cream or butter sauce. Meat and other protein foods Fatty cuts of meat. Ribs. Fried meat. Berniece Salines. Sausage. Bologna and other processed lunch meats. Salami. Fatback. Hotdogs. Bratwurst. Salted nuts and seeds. Canned beans with added salt. Canned or smoked fish. Whole eggs or egg yolks. Chicken or [place] with skin. Dairy Whole or 2% milk, cream, and half-and-half. Whole or full-fat cream cheese. Whole-fat or sweetened yogurt. Full-fat cheese. Nondairy creamers. Whipped toppings. Processed cheese and cheese spreads. Fats and oils Butter. Stick margarine. Lard. Shortening. Ghee. Bacon fat. Tropical oils, such as coconut, palm kernel, or palm oil. Seasoning and other foods Salted popcorn and pretzels. Onion salt, garlic salt, seasoned salt, table salt, and sea salt. Worcestershire sauce. Tartar sauce. Barbecue sauce. Teriyaki sauce. Soy sauce, including reduced-sodium. Steak sauce. Canned and packaged gravies. Fish sauce. Oyster sauce. Cocktail sauce. Horseradish that you find on the shelf. Ketchup. Mustard. Meat flavorings and tenderizers. Bouillon cubes. Hot sauce and Tabasco sauce. Premade or packaged marinades. Premade or packaged taco seasonings. Relishes. Regular salad dressings. Where to find more information:  National Heart, Lung, and Blood Institute:  PopSteam.iswww.nhlbi.nih.gov  American Heart Association: www.heart.org Summary  The DASH eating plan is a healthy eating plan that has been shown to reduce high blood pressure (hypertension). It may also reduce your risk for type 2 diabetes, heart disease, and stroke.  With the DASH eating plan, you should limit salt (sodium) intake to 2,300 mg a day. If you have hypertension, you may need to reduce your sodium intake to 1,500 mg a day.  When on the DASH eating plan, aim to eat more fresh fruits and vegetables, whole grains, lean proteins,  low-fat dairy, and heart-healthy fats.  Work with your health care provider or diet and nutrition specialist (dietitian) to adjust your eating plan to your individual calorie needs. This information is not intended to replace advice given to you by your health care provider. Make sure you discuss any questions you have with your health care provider. Document Released: 03/02/2011 Document Revised: 02/23/2017 Document Reviewed: 03/06/2016 Elsevier Patient Education  2020 [employer]

## 2018-10-28 LAB — REFLEX TIQ

## 2018-10-28 LAB — ACUTE HEP PANEL AND HEP B SURFACE AB
HEPATITIS C ANTIBODY REFILL$(REFL): NONREACTIVE
Hep A IgM: NONREACTIVE
Hep B C IgM: NONREACTIVE
Hepatitis B Surface Ag: NONREACTIVE
SIGNAL TO CUT-OFF: 0.01 (ref <1.00)

## 2018-10-30 ENCOUNTER — Other Ambulatory Visit: Payer: Self-pay

## 2018-10-30 DIAGNOSIS — E7800 Pure hypercholesterolemia, unspecified: Secondary | ICD-10-CM

## 2018-10-30 MED ORDER — ATORVASTATIN CALCIUM 20 MG PO TABS: 20.0000 mg | ORAL_TABLET | Freq: Every day | ORAL | 1 refills | Status: DC

## 2018-11-07 ENCOUNTER — Ambulatory Visit
Admission: RE | Admit: 2018-11-07 | Discharge: 2018-11-07 | Disposition: A | Discharge status: Home / Self Care | Payer: 59 | Source: Ambulatory Visit | Attending: Physician Assistant | Admitting: Physician Assistant

## 2018-11-07 ENCOUNTER — Other Ambulatory Visit: Payer: Self-pay

## 2018-11-07 DIAGNOSIS — Z122 Encounter for screening for malignant neoplasm of respiratory organs: Secondary | ICD-10-CM

## 2018-11-07 NOTE — Addendum Note (Signed)
Addended by: Katina Dung on: 11/07/2018 11:32 AM   Modules accepted: Orders

## 2018-11-09 LAB — COLOGUARD: Cologuard: POSITIVE — AB

## 2018-11-11 ENCOUNTER — Other Ambulatory Visit: Payer: Self-pay | Admitting: *Deleted

## 2018-11-11 DIAGNOSIS — J439 Emphysema, unspecified: Secondary | ICD-10-CM

## 2018-11-12 ENCOUNTER — Other Ambulatory Visit: Payer: Self-pay

## 2018-11-12 DIAGNOSIS — R195 Other fecal abnormalities: Secondary | ICD-10-CM

## 2018-11-13 ENCOUNTER — Encounter: Payer: Self-pay | Admitting: Gastroenterology

## 2018-11-20 ENCOUNTER — Telehealth: Payer: Self-pay | Admitting: *Deleted

## 2018-11-20 NOTE — Telephone Encounter (Signed)
Thanks for letting me know. He needs a diagnostic colonoscopy due to the Positive Cologuard. Unless his PCP feels that they can give medical optimization/clearance without the stress test they ordered, I do not feel comfortable nor would Anesthesia with performing a procedure. Will defer decision as to how he is stratified or if they choose to have patient seen by Cardiology first for optimization prior to procedure date. Those would be the 2 means by which we would feel comfortable pursuing his colonoscopy.  Justice Britain, MD [facility] Office # [phone]

## 2018-11-20 NOTE — Telephone Encounter (Signed)
Called patient no answer, left message for pt. He needs Exercise stress test that was ordered by Raiford Noble, PA at [facility] for Atypical chest pain. Patient needs this completed and results before colonoscopy on 12/18/2018.

## 2018-11-20 NOTE — Telephone Encounter (Signed)
Dr.Mansouraty and Osvaldo Angst, CRNA,    The patient is scheduled for direct colonoscopy for + cologuard. He was seen by his PCP [facility] on 10/25/2018 for Atypical chest pain. The PCP ordered an exercise stress test. I contacted the PCP office to see if this could get scheduled before colonoscopy on 12/18/2018.  I received a call back from [place] at PCP office telling me that she tried to make this appointment for the stress test at [facility] and she was told by them that they are not scheduling any stress test due to Covid-19. Please review chart. Is the patient okay to have the colonoscopy at [facility] at this time without stress test? Thank you, Robbin pv

## 2018-11-20 NOTE — Telephone Encounter (Signed)
Called to get this scheduled and was made aware by [facility] that they are not scheduling Exercise Tolerance test at this time. I have made Robbin from [facility] aware of this aswell. Please advise if there is anything else we can do in place of this.

## 2018-11-20 NOTE — Telephone Encounter (Signed)
Levonne Spiller, RN  Jodell Cipro, CMA        Good morning! The patient is scheduled for a colonoscopy with Dr.Mansouraty on 12/18/2018. I see in PCP OV notes on 10/25/2018 the patient was seen for Atypical Chest pain and needs an Exercise Tolerance test for this. We need that completed BEFORE colonoscopy because we give propofol sedation. Will you please order this and let the patient know! Thanks so much. Neima Lacross,RN.

## 2018-11-21 ENCOUNTER — Other Ambulatory Visit: Payer: Self-pay | Admitting: Emergency Medicine

## 2018-11-21 DIAGNOSIS — R0789 Other chest pain: Secondary | ICD-10-CM

## 2018-11-21 DIAGNOSIS — Z01818 Encounter for other preprocedural examination: Secondary | ICD-10-CM

## 2018-11-21 NOTE — Telephone Encounter (Signed)
Spoke to pt and exp[lained that we have to have cardiac clearance before doing a colonoscopy- pt aware of pending cardiology referral- I canceled the 9-9 PV and THE 9-23 COLON - I placed a 12/26/2018 recall # [id number] in [place]- Pt also will call back once all cardiac issues have been resolved to RS- Pt is aware with the + cologuard he needs this colon - he will CB to [facility]

## 2018-11-21 NOTE — Telephone Encounter (Signed)
Dr. Rush Landmark and Laurine Blazer,  It would best if this pt's exercise stress test was completed and he was cleared from a cardiac standpoint prior to proceeding with his colonoscopy.  Thanks,  Osvaldo Angst

## 2018-11-21 NOTE — Telephone Encounter (Signed)
[facility] as Cardiology has noted they are not doing OP EST at this time, please place urgent referral to Cardiology for him so they can assess and determine nuclear stress testing, EST or able to grant cardiac clearance for diagnostic colonoscopy.

## 2018-11-21 NOTE — Telephone Encounter (Signed)
See separate phone note. Duplicate.

## 2018-11-21 NOTE — Telephone Encounter (Signed)
Cardiology referral placed urgently for atypical chest pains and cardiac clearance. Once patient is cleared, then can proceed with colonoscopy

## 2018-12-05 ENCOUNTER — Ambulatory Visit: Payer: 59 | Admitting: Pulmonary Disease

## 2018-12-05 ENCOUNTER — Encounter: Payer: Self-pay | Admitting: Pulmonary Disease

## 2018-12-05 ENCOUNTER — Other Ambulatory Visit: Payer: Self-pay

## 2018-12-05 VITALS — BP 120/80 | HR 74 | Temp 97.5°F | Ht 73.0 in | Wt 176.8 lb

## 2018-12-05 DIAGNOSIS — J439 Emphysema, unspecified: Secondary | ICD-10-CM | POA: Diagnosis not present

## 2018-12-05 MED ORDER — TRELEGY ELLIPTA 100-62.5-25 MCG/INH IN AEPB: 1.0000 | INHALATION_SPRAY | Freq: Every day | RESPIRATORY_TRACT | 0 refills | Status: DC

## 2018-12-05 NOTE — Patient Instructions (Addendum)
Schedule you for pulmonary function test for better evaluation of lung We will give a sample of Trelegy inhaler.  Use this for 2 weeks If his symptoms improve then we can send in a prescription Follow-up after PFTs.

## 2018-12-05 NOTE — Progress Notes (Signed)
Rodney Daniels    [id number]    1950/07/12  Primary Care Physician:Martin, Luanna Cole, PA-C  Referring Physician: Brunetta Jeans, PA-C [address] [place],  [place] [address]  Chief complaint: Consult for emphysema  HPI: 68 year old heavy smoker sent here for evaluation of emphysema noted on recent screening CT of the chest He gets short of breath on significant exertion.  No cough, sputum production, wheezing.  Has sharp chest pain that extends from the left chest to the right with no specific aggravating or relieving factors.  He is scheduled to see cardiology later this month for evaluation  Pets: Dogs.  No birds, farm animals Occupation: Works as a Sports coach for [employer]: Exposed to cleaning products.  Has seen mold at his trailer home.  No hot tub, Jacuzzi, humidifier Smoking history: 55-pack-year smoker.  Continues to smoke 1 pack/day Travel history: Previously lived in [place], [place], [place].  No significant recent travel Relevant family history: Father died of lung cancer.  He was a smoker.  Outpatient Encounter Medications as of 12/05/2018  Medication Sig  . atorvastatin (LIPITOR) 20 MG tablet Take 1 tablet (20 mg total) by mouth daily.   No facility-administered encounter medications on file as of 12/05/2018.     Allergies as of 12/05/2018  . (No Known Allergies)    Past Medical History:  Diagnosis Date  . Allergy   . History of chicken pox     Past Surgical History:  Procedure Laterality Date  . thumb surgery      Family History  Problem Relation Age of Onset  . Heart disease Mother   . Cancer Father        lung    Social History   Socioeconomic History  . Marital status: Married    Spouse name: Not on file  . Number of children: Not on file  . Years of education: Not on file  . Highest education level: Not on file  Occupational History  . Not on file  Social Needs  . Financial resource strain: Not on file   . Food insecurity    Worry: Not on file    Inability: Not on file  . Transportation needs    Medical: Not on file    Non-medical: Not on file  Tobacco Use  . Smoking status: Current Every Day Smoker    Packs/day: 1.00    Years: 55.00    Pack years: 55.00    Types: Cigarettes  . Smokeless tobacco: Never Used  Substance and Sexual Activity  . Alcohol use: Yes    Comment: rarely  . Drug use: No  . Sexual activity: Not Currently  Lifestyle  . Physical activity    Days per week: Not on file    Minutes per session: Not on file  . Stress: Not on file  Relationships  . Social Herbalist on phone: Not on file    Gets together: Not on file    Attends religious service: Not on file    Active member of club or organization: Not on file    Attends meetings of clubs or organizations: Not on file    Relationship status: Not on file  . Intimate partner violence    Fear of current or ex partner: Not on file    Emotionally abused: Not on file    Physically abused: Not on file    Forced sexual activity: Not on  file  Other Topics Concern  . Not on file  Social History Narrative  . Not on file    Review of systems: Review of Systems  Constitutional: Negative for fever and chills.  HENT: Negative.   Eyes: Negative for blurred vision.  Respiratory: as per HPI  Cardiovascular: Negative for chest pain and palpitations.  Gastrointestinal: Negative for vomiting, diarrhea, blood per rectum. Genitourinary: Negative for dysuria, urgency, frequency and hematuria.  Musculoskeletal: Negative for myalgias, back pain and joint pain.  Skin: Negative for itching and rash.  Neurological: Negative for dizziness, tremors, focal weakness, seizures and loss of consciousness.  Endo/Heme/Allergies: Negative for environmental allergies.  Psychiatric/Behavioral: Negative for depression, suicidal ideas and hallucinations.  All other systems reviewed and are negative.  Physical Exam: Blood  pressure 120/80, pulse 74, temperature (!) 97.5 F (36.4 C), temperature source Temporal, height 6\' 1"  (1.854 m), weight 176 lb 12.8 oz (80.2 kg), SpO2 98 %. Gen:      No acute distress HEENT:  EOMI, sclera anicteric Neck:     No masses; no thyromegaly Lungs:    Clear to auscultation bilaterally; normal respiratory effort CV:         Regular rate and rhythm; no murmurs Abd:      + bowel sounds; soft, non-tender; no palpable masses, no distension Ext:    No edema; adequate peripheral perfusion Skin:      Warm and dry; no rash Neuro: alert and oriented x 3 Psych: normal mood and affect  Data Reviewed: Imaging: Low-dose screening CT chest 11/07/2018-.  Mild emphysema. few pulmonary nodules.  The largest is 4.4 mm in the right upper lobe.  I have reviewed the images personally.  Labs: CBC 10/03/2018-WBC 7.3, eos 4.1%, absolute eosinophil count 299  Assessment:  Consult for emphysema Mild changes of emphysema noted on CT scan Currently does not have significant dyspnea but has atypical chest pain Do not think that the chest pain is related to the emphysema.  We will give him a trial sample of Trelegy inhaler to see if it improves symptoms Schedule pulmonary function test  Smoker Discussed smoking cessation with patient.  He is agreed to stop smoking and feels he can do it on his own without any additional help Continue low-dose screening CTs of the chest.  Plan/Recommendations: - Sample of Trelegy - Pulmonary function test  Chilton GreathousePraveen Clairissa Valvano MD [facility] 12/05/2018, 10:23 AM  CC: Waldon MerlMartin, William C, PA-C

## 2018-12-17 ENCOUNTER — Ambulatory Visit (INDEPENDENT_AMBULATORY_CARE_PROVIDER_SITE_OTHER): Payer: 59

## 2018-12-17 ENCOUNTER — Encounter: Payer: Self-pay | Admitting: Cardiology

## 2018-12-17 ENCOUNTER — Other Ambulatory Visit: Payer: Self-pay

## 2018-12-17 ENCOUNTER — Ambulatory Visit: Payer: 59 | Admitting: Cardiology

## 2018-12-17 VITALS — BP 144/95 | HR 78 | Temp 97.2°F | Ht 73.0 in | Wt 178.0 lb

## 2018-12-17 DIAGNOSIS — R072 Precordial pain: Secondary | ICD-10-CM

## 2018-12-17 DIAGNOSIS — E7800 Pure hypercholesterolemia, unspecified: Secondary | ICD-10-CM | POA: Diagnosis not present

## 2018-12-17 DIAGNOSIS — Z716 Tobacco abuse counseling: Secondary | ICD-10-CM

## 2018-12-17 DIAGNOSIS — Z7189 Other specified counseling: Secondary | ICD-10-CM | POA: Diagnosis not present

## 2018-12-17 DIAGNOSIS — Z01810 Encounter for preprocedural cardiovascular examination: Secondary | ICD-10-CM

## 2018-12-17 DIAGNOSIS — Z01812 Encounter for preprocedural laboratory examination: Secondary | ICD-10-CM | POA: Diagnosis not present

## 2018-12-17 DIAGNOSIS — Z720 Tobacco use: Secondary | ICD-10-CM | POA: Diagnosis not present

## 2018-12-17 MED ORDER — METOPROLOL TARTRATE 50 MG PO TABS: ORAL_TABLET | ORAL | 0 refills | Status: DC

## 2018-12-17 NOTE — Progress Notes (Signed)
Cardiology Office Note:    Date:  12/17/2018   ID:  Rodney Daniels, DOB [birth year], MRN [id number]  PCP:  Waldon MerlMartin, William C, PA-C  Cardiologist:  Jodelle RedBridgette Marsi Turvey, MD PhD  Referring MD: Waldon MerlMartin, William C, PA-C   CC: chest pain  History of Present Illness:    Rodney Daniels is a 68 y.o. male with a hx of tobacco use, hyperlipidemia who is seen as a new consult at the request of Noel JourneyMartin, William C, PA-C for the evaluation and management of chest pain, preoperative evaluation.  Chest pain: -Initial onset: few months -Quality: sharp, starts left lateral ribs and goes to right side the majority of the time, occasionally dull pain. Always in the same area.  -Frequency: in last 3 days, has been daily -Duration: few seconds to a minutes usually, longest 15 minutes.  -Associated symptoms: no nausea, dizzyness/lightheadeness, shortness of breath, syncope -Aggravating/alleviating factors: no clear triggers. Can be walking, sitting, lying in bed. No clear aggravating or alleviating facotrs -Prior cardiac history: none -Prior workup: ETT ordered, not yet performed. Stress test many years ago, was normal -Prior treatment: none -Alcohol: 2-3 shots/day -Tobacco: current smoker, was 1.5-2 ppd, down to 1/2 ppd. >50 pack year history. Working on quitting -Comorbidities: hypercholesterolemia, started atorvastatin 10/30/18. Last LDL 175. -Exercise level: very active at work, works at home and in the yard. Can climb stairs, lift items, do yardwork. Raked grass for 6 hours without issues. -Cardiac ROS: no shortness of breath, no PND, no orthopnea, no LE edema, no syncope -Family history: mother had MI in her late 7470s. No one else that he knows of  Checks BP on monitor at work, usually 110s-120s/80s-90s.  Preoperative cardiovascular evaluation: Planned surgery: colonoscopy, date TBD  Pertinent past cardiac history: none Prior cardiac workup: distant stress test History of valve disease:  none History of CAD/PAD/CVA/TIA: none History of heart failure: none History of arrhythmia: none On anticoagulation: none Additional history (hypertension, diabetes/on insulin, CKD/current creatinine, OSA, anesthesia complications): no other history of complications/comorbid conditions Current symptoms: chest pain, as above Functional capacity: Much greater than 4 METs  Past Medical History:  Diagnosis Date   Allergy    History of chicken pox     Past Surgical History:  Procedure Laterality Date   thumb surgery      Current Medications: Current Outpatient Medications on File Prior to Visit  Medication Sig   atorvastatin (LIPITOR) 20 MG tablet Take 1 tablet (20 mg total) by mouth daily.   No current facility-administered medications on file prior to visit.      Allergies:   Patient has no known allergies.   Social History   Socioeconomic History   Marital status: Married    Spouse name: Not on file   Number of children: Not on file   Years of education: Not on file   Highest education level: Not on file  Occupational History   Not on file  Social Needs   Financial resource strain: Not on file   Food insecurity    Worry: Not on file    Inability: Not on file   Transportation needs    Medical: Not on file    Non-medical: Not on file  Tobacco Use   Smoking status: Current Every Day Smoker    Packs/day: 1.00    Years: 55.00    Pack years: 55.00    Types: Cigarettes   Smokeless tobacco: Never Used  Substance and Sexual Activity   Alcohol use: Yes  Comment: rarely   Drug use: No   Sexual activity: Not Currently  Lifestyle   Physical activity    Days per week: Not on file    Minutes per session: Not on file   Stress: Not on file  Relationships   Social connections    Talks on phone: Not on file    Gets together: Not on file    Attends religious service: Not on file    Active member of club or organization: Not on file    Attends  meetings of clubs or organizations: Not on file    Relationship status: Not on file  Other Topics Concern   Not on file  Social History Narrative   Not on file     Family History: The patient's family history includes Cancer in his father; Heart disease in his mother.  ROS:   Please see the history of present illness.  Additional pertinent ROS: Constitutional: Negative for chills, fever, night sweats, unintentional weight loss  HENT: Negative for ear pain and hearing loss.   Eyes: Negative for loss of vision and eye pain.  Respiratory: Negative for cough, sputum, wheezing.   Cardiovascular: See HPI. Gastrointestinal: Negative for abdominal pain, melena, and hematochezia.  Genitourinary: Negative for dysuria and hematuria.  Musculoskeletal: Negative for falls and myalgias.  Skin: Negative for itching and rash.  Neurological: Negative for focal weakness, focal sensory changes and loss of consciousness.  Endo/Heme/Allergies: Does not bruise/bleed easily.     EKGs/Labs/Other Studies Reviewed:    The following studies were reviewed today: None  EKG:  EKG is personally reviewed.  The ekg ordered today demonstrates NSR, no R in V1/V2  Recent Labs: 10/25/2018: ALT 10; BUN 14; Creatinine, Ser 1.01; Hemoglobin 16.0; Platelets 204.0; Potassium 4.5; Sodium 140  Recent Lipid Panel    Component Value Date/Time   CHOL 239 (H) 10/25/2018 0907   TRIG 119.0 10/25/2018 0907   HDL 40.60 10/25/2018 0907   CHOLHDL 6 10/25/2018 0907   VLDL 23.8 10/25/2018 0907   LDLCALC 175 (H) 10/25/2018 0907   LDLDIRECT 208.3 09/19/2011 1349    Physical Exam:    VS:  BP (!) 144/95    Pulse 78    Temp (!) 97.2 F (36.2 C)    Ht 6\' 1"  (1.854 m)    Wt 178 lb (80.7 kg)    SpO2 99%    BMI 23.48 kg/m     Wt Readings from Last 3 Encounters:  12/17/18 178 lb (80.7 kg)  12/05/18 176 lb 12.8 oz (80.2 kg)  10/25/18 175 lb (79.4 kg)    GEN: Well nourished, well developed in no acute distress HEENT: Normal,  moist mucous membranes NECK: No JVD CARDIAC: regular rhythm, normal S1 and S2, no murmurs, rubs, gallops.  VASCULAR: Radial and DP pulses 2+ bilaterally. No carotid bruits RESPIRATORY:  Clear to auscultation without rales, wheezing or rhonchi  ABDOMEN: Soft, non-tender, non-distended MUSCULOSKELETAL:  Ambulates independently SKIN: Warm and dry, no edema NEUROLOGIC:  Alert and oriented x 3. No focal neuro deficits noted. PSYCHIATRIC:  Normal affect    ASSESSMENT:    1. Precordial pain   2. Pre-procedure lab exam   3. Preop cardiovascular exam   4. Cardiac risk counseling   5. Counseling on health promotion and disease prevention   6. Tobacco abuse   7. Tobacco abuse counseling    PLAN:    Preoperative cardiovascular exam: given that colonoscopy is considered low risk procedure, typically we do not recommend additional  cardiovascular evaluation prior to procedure. However, if this is not an urgent procedure, it would be reasonable to wait until results of workup for chest pain.  Atypical chest pain: does have significant risk factors/elevated ASCVD risk --discussed treadmill stress, nuclear stress/lexiscan, and CT coronary angiography. Discussed pros and cons of each, including but not limited to false positive/false negative risk, radiation risk, and risk of IV contrast dye. Based on shared decision making, decision was made to pursue CT coronary angiography. -will give one time dose of metoprolol -counseled on need to get BMET one week prior to test -counseled on use of sublingual nitroglycerin and its importance to a good test  Tobacco use: The patient was counseled on tobacco cessation today for 5 minutes.  Counseling included reviewing the risks of smoking tobacco products, how it impacts the patient's current medical diagnoses and different strategies for quitting.  Pharmacotherapy to aid in tobacco cessation was not prescribed today.  Cardiac risk counseling and prevention  recommendations: -recommend heart healthy/Mediterranean diet, with whole grains, fruits, vegetable, fish, lean meats, nuts, and olive oil. Limit salt. -recommend moderate walking, 3-5 times/week for 30-50 minutes each session. Aim for at least 150 minutes.week. Goal should be pace of 3 miles/hours, or walking 1.5 miles in 30 minutes -recommend avoidance of tobacco products. Avoid excess alcohol. -Additional risk factor control:  -Diabetes: A1c is 5.5, no history  -Lipids: Tchol 124, LDL 69, HDL 44, TG 57. On atorvastatin 20 mg daily for elevated ASCVD risk.  -Blood pressure control: elevated today but reports no prior history, on no home meds.  -Weight: BMI 23, normal weight. -ASCVD risk score: The 10-year ASCVD risk score Denman George DC Montez Hageman., et al., 2013) is: 29%   Values used to calculate the score:     Age: 34 years     Sex: Male     Is Non-Hispanic African American: No     Diabetic: No     Tobacco smoker: Yes     Systolic Blood Pressure: 144 mmHg     Is BP treated: No     HDL Cholesterol: 40.6 mg/dL     Total Cholesterol: 239 mg/dL    Plan for follow up: 3 mos or sooner PRN  Medication Adjustments/Labs and Tests Ordered: Current medicines are reviewed at length with the patient today.  Concerns regarding medicines are outlined above.  Orders Placed This Encounter  Procedures   CT CORONARY MORPH W/CTA COR W/SCORE W/CA W/CM &/OR WO/CM   CT CORONARY FRACTIONAL FLOW RESERVE DATA PREP   CT CORONARY FRACTIONAL FLOW RESERVE FLUID ANALYSIS   Basic metabolic panel   EKG 12-Lead   Meds ordered this encounter  Medications   metoprolol tartrate (LOPRESSOR) 50 MG tablet    Sig: TAKE 1 TABLET 2 HR PRIOR TO CARDIAC PROCEDURE    Dispense:  1 tablet    Refill:  0    Patient Instructions  Medication Instructions:  Your Physician recommend you continue on your current medication as directed.    If you need a refill on your cardiac medications before your next appointment, please call  your pharmacy.   Lab work: Your physician recommends that you return for lab work 1 week prior to procedure (BMP)  If you have labs (blood work) drawn today and your tests are completely normal, you will receive your results only by:  MyChart Message (if you have MyChart) OR  A paper copy in the mail If you have any lab test that is abnormal or we need  to change your treatment, we will call you to review the results.  Testing/Procedures: Your physician has requested that you have cardiac CT. Cardiac computed tomography (CT) is a painless test that uses an x-ray machine to take clear, detailed pictures of your heart. For further information please visit HugeFiesta.tn. Please follow instruction sheet as given. [facility]    Follow-Up: At [facility], you and your health needs are our priority.  As part of our continuing mission to provide you with exceptional heart care, we have created designated Provider Care Teams.  These Care Teams include your primary Cardiologist (physician) and Advanced Practice Providers (APPs -  Physician Assistants and Nurse Practitioners) who all work together to provide you with the care you need, when you need it. You will need a follow up appointment in 3 months.  Please call our office 2 months in advance to schedule this appointment.  You may see Dr. Harrell Gave or one of the following Advanced Practice Providers on your designated Care Team:   Rosaria Ferries, PA-C  Jory Sims, DNP, ANP  Your cardiac CT will be scheduled at one of the below locations:   [facility] [address] [place], [place] [address] [phone]  If scheduled at [facility], please arrive at the [facility] main entrance of [facility] 30-45 minutes prior to test start time. Proceed to the [facility] Radiology Department (first floor) to check-in and test prep.  If scheduled at [facility], please  arrive 15 mins early for check-in and test prep.  Please follow these instructions carefully (unless otherwise directed):  Hold all erectile dysfunction medications at least 3 days (72 hrs) prior to test.  On the Night Before the Test:  Be sure to Drink plenty of water.  Do not consume any caffeinated/decaffeinated beverages or chocolate 12 hours prior to your test.  Do not take any antihistamines 12 hours prior to your test.   On the Day of the Test:  Drink plenty of water. Do not drink any water within one hour of the test.  Do not eat any food 4 hours prior to the test.  You may take your regular medications prior to the test.   Take metoprolol (Lopressor) two hours prior to test.        After the Test:  Drink plenty of water.  After receiving IV contrast, you may experience a mild flushed feeling. This is normal.  On occasion, you may experience a mild rash up to 24 hours after the test. This is not dangerous. If this occurs, you can take Benadryl 25 mg and increase your fluid intake.  If you experience trouble breathing, this can be serious. If it is severe call 911 IMMEDIATELY. If it is mild, please call our office.  If you take any of these medications: Glipizide/Metformin, Avandament, Glucavance, please do not take 48 hours after completing test unless otherwise instructed.    Please contact the cardiac imaging nurse navigator should you have any questions/concerns Marchia Bond, RN Navigator Cardiac Imaging [facility] [phone] Office  [phone] Cell       Signed, Buford Dresser, MD PhD 12/17/2018  [place]

## 2018-12-17 NOTE — Patient Instructions (Addendum)
Medication Instructions:  Your Physician recommend you continue on your current medication as directed.    If you need a refill on your cardiac medications before your next appointment, please call your pharmacy.   Lab work: Your physician recommends that you return for lab work 1 week prior to procedure (BMP)  If you have labs (blood work) drawn today and your tests are completely normal, you will receive your results only by: [name] Kitchen MyChart Message (if you have MyChart) OR . A paper copy in the mail If you have any lab test that is abnormal or we need to change your treatment, we will call you to review the results.  Testing/Procedures: Your physician has requested that you have cardiac CT. Cardiac computed tomography (CT) is a painless test that uses an x-ray machine to take clear, detailed pictures of your heart. For further information please visit HugeFiesta.tn. Please follow instruction sheet as given. [facility]    Follow-Up: At [facility], you and your health needs are our priority.  As part of our continuing mission to provide you with exceptional heart care, we have created designated Provider Care Teams.  These Care Teams include your primary Cardiologist (physician) and Advanced Practice Providers (APPs -  Physician Assistants and Nurse Practitioners) who all work together to provide you with the care you need, when you need it. You will need a follow up appointment in 3 months.  Please call our office 2 months in advance to schedule this appointment.  You may see Dr. Harrell Gave or one of the following Advanced Practice Providers on your designated Care Team:   Rosaria Ferries, PA-C . Jory Sims, DNP, ANP  Your cardiac CT will be scheduled at one of the below locations:   [facility] [address] [place], [place] [address] [phone]  If scheduled at [facility], please arrive at the [facility] main entrance of [facility] 30-45 minutes prior to test start time. Proceed to the [facility] Radiology Department (first floor) to check-in and test prep.  If scheduled at [facility], please arrive 15 mins early for check-in and test prep.  Please follow these instructions carefully (unless otherwise directed):  Hold all erectile dysfunction medications at least 3 days (72 hrs) prior to test.  On the Night Before the Test: . Be sure to Drink plenty of water. . Do not consume any caffeinated/decaffeinated beverages or chocolate 12 hours prior to your test. . Do not take any antihistamines 12 hours prior to your test.   On the Day of the Test: . Drink plenty of water. Do not drink any water within one hour of the test. . Do not eat any food 4 hours prior to the test. . You may take your regular medications prior to the test.  . Take metoprolol (Lopressor) two hours prior to test.        After the Test: . Drink plenty of water. . After receiving IV contrast, you may experience a mild flushed feeling. This is normal. . On occasion, you may experience a mild rash up to 24 hours after the test. This is not dangerous. If this occurs, you can take Benadryl 25 mg and increase your fluid intake. . If you experience trouble breathing, this can be serious. If it is severe call 911 IMMEDIATELY. If it is mild, please call our office. . If you take any of these medications: Glipizide/Metformin, Avandament, Glucavance, please do not take 48  hours after completing test unless otherwise instructed.    Please contact the cardiac imaging nurse navigator should you have any questions/concerns Rockwell Alexandria, RN Navigator Cardiac Imaging [facility] [phone] Office  [phone] Cell

## 2018-12-18 ENCOUNTER — Encounter: Payer: 59 | Admitting: Gastroenterology

## 2018-12-18 LAB — HEPATIC FUNCTION PANEL
ALT: 12 U/L (ref 0–53)
AST: 13 U/L (ref 0–37)
Albumin: 4.2 g/dL (ref 3.5–5.2)
Alkaline Phosphatase: 93 U/L (ref 39–117)
Bilirubin, Direct: 0.2 mg/dL (ref 0.0–0.3)
Total Bilirubin: 1 mg/dL (ref 0.2–1.2)
Total Protein: 6.3 g/dL (ref 6.0–8.3)

## 2018-12-18 LAB — LIPID PANEL
Cholesterol: 169 mg/dL (ref 0–200)
HDL: 44.5 mg/dL (ref >39.00)
LDL Cholesterol: 108 mg/dL — ABNORMAL HIGH (ref 0–99)
NonHDL: 124.26
Total CHOL/HDL Ratio: 4
Triglycerides: 79 mg/dL (ref 0.0–149.0)
VLDL: 15.8 mg/dL (ref 0.0–40.0)

## 2018-12-19 ENCOUNTER — Other Ambulatory Visit: Payer: Self-pay | Admitting: Emergency Medicine

## 2018-12-19 DIAGNOSIS — E785 Hyperlipidemia, unspecified: Secondary | ICD-10-CM

## 2018-12-19 MED ORDER — ATORVASTATIN CALCIUM 20 MG PO TABS: 20.0000 mg | ORAL_TABLET | Freq: Every day | ORAL | 1 refills | Status: DC

## 2018-12-23 ENCOUNTER — Encounter: Payer: Self-pay | Admitting: Cardiology

## 2018-12-31 ENCOUNTER — Other Ambulatory Visit: Payer: Self-pay

## 2018-12-31 DIAGNOSIS — R0789 Other chest pain: Secondary | ICD-10-CM

## 2019-01-08 ENCOUNTER — Other Ambulatory Visit: Payer: Self-pay

## 2019-01-08 NOTE — Addendum Note (Signed)
Addended by: Doran Clay A on: 01/08/2019 03:06 PM   Modules accepted: Orders

## 2019-01-13 ENCOUNTER — Encounter: Payer: Self-pay | Admitting: Physician Assistant

## 2019-01-27 ENCOUNTER — Other Ambulatory Visit (HOSPITAL_COMMUNITY)
Admission: RE | Admit: 2019-01-27 | Discharge: 2019-01-27 | Disposition: A | Discharge status: Home / Self Care | Payer: 59 | Source: Ambulatory Visit | Attending: Physician Assistant | Admitting: Physician Assistant

## 2019-01-27 DIAGNOSIS — Z20828 Contact with and (suspected) exposure to other viral communicable diseases: Secondary | ICD-10-CM | POA: Diagnosis not present

## 2019-01-27 DIAGNOSIS — Z01812 Encounter for preprocedural laboratory examination: Secondary | ICD-10-CM | POA: Diagnosis not present

## 2019-01-28 LAB — NOVEL CORONAVIRUS, NAA (HOSP ORDER, SEND-OUT TO REF LAB; TAT 18-24 HRS): SARS-CoV-2, NAA: NOT DETECTED

## 2019-01-29 ENCOUNTER — Telehealth (HOSPITAL_COMMUNITY): Payer: Self-pay

## 2019-01-29 NOTE — Telephone Encounter (Signed)
Encounter complete. 

## 2019-01-30 ENCOUNTER — Other Ambulatory Visit: Payer: Self-pay

## 2019-01-30 ENCOUNTER — Ambulatory Visit (HOSPITAL_COMMUNITY)
Admission: RE | Admit: 2019-01-30 | Discharge: 2019-01-30 | Disposition: A | Discharge status: Home / Self Care | Payer: 59 | Source: Ambulatory Visit | Attending: Cardiology | Admitting: Cardiology

## 2019-01-30 DIAGNOSIS — R0789 Other chest pain: Secondary | ICD-10-CM | POA: Diagnosis not present

## 2019-01-31 LAB — EXERCISE TOLERANCE TEST
Estimated workload: 7 METS
Exercise duration (min): 6 min
Exercise duration (sec): 0 s
RPE: 17
Rest HR: 75 {beats}/min

## 2019-02-07 ENCOUNTER — Other Ambulatory Visit: Payer: Self-pay | Admitting: Physician Assistant

## 2019-02-07 DIAGNOSIS — R9430 Abnormal result of cardiovascular function study, unspecified: Secondary | ICD-10-CM

## 2019-02-07 NOTE — Progress Notes (Signed)
As this was nondiagnostic, I would recommend an alternative test. I am happy to discuss this with him, but he could also have a nuclear stress test for more definitive evaluation. It may be easiest if you would like to order the nuclear stress now, then I can review with him at our follow up (or review sooner if abnormal). Thank you for the update.

## 2019-02-13 ENCOUNTER — Telehealth (HOSPITAL_COMMUNITY): Payer: Self-pay | Admitting: *Deleted

## 2019-02-13 NOTE — Telephone Encounter (Signed)
Patient given detailed instructions per Myocardial Perfusion Study Information Sheet for the test on 02/17/19. Patient notified to arrive 15 minutes early and that it is imperative to arrive on time for appointment to keep from having the test rescheduled.  If you need to cancel or reschedule your appointment, please call the office within 24 hours of your appointment. . Patient verbalized understanding. Manreet Kiernan Jacqueline    

## 2019-02-17 ENCOUNTER — Encounter (HOSPITAL_COMMUNITY): Payer: Self-pay | Admitting: *Deleted

## 2019-02-17 ENCOUNTER — Ambulatory Visit (HOSPITAL_COMMUNITY): Payer: 59 | Attending: Cardiovascular Disease

## 2019-02-17 ENCOUNTER — Other Ambulatory Visit: Payer: Self-pay

## 2019-02-17 DIAGNOSIS — R9430 Abnormal result of cardiovascular function study, unspecified: Secondary | ICD-10-CM | POA: Insufficient documentation

## 2019-02-17 MED ORDER — TECHNETIUM TC 99M TETROFOSMIN IV KIT
32.9000 | PACK | Freq: Once | INTRAVENOUS | Status: AC | PRN
  Administered 2019-02-17: 32.9 via INTRAVENOUS
  Filled 2019-02-17: qty 33

## 2019-02-18 ENCOUNTER — Ambulatory Visit (HOSPITAL_COMMUNITY): Payer: 59 | Attending: Cardiology

## 2019-02-18 DIAGNOSIS — R9430 Abnormal result of cardiovascular function study, unspecified: Secondary | ICD-10-CM | POA: Insufficient documentation

## 2019-02-18 LAB — MYOCARDIAL PERFUSION IMAGING
LV dias vol: 96 mL (ref 62–150)
LV sys vol: 40 mL
Peak HR: 104 {beats}/min
Rest HR: 69 {beats}/min
SDS: 0
SRS: 0
SSS: 0
TID: 0.98

## 2019-02-18 MED ORDER — TECHNETIUM TC 99M TETROFOSMIN IV KIT
32.1000 | PACK | Freq: Once | INTRAVENOUS | Status: AC | PRN
  Administered 2019-02-18: 32.1 via INTRAVENOUS
  Filled 2019-02-18: qty 33

## 2019-02-18 MED ORDER — REGADENOSON 0.4 MG/5ML IV SOLN
0.4000 mg | Freq: Once | INTRAVENOUS | Status: AC
  Administered 2019-02-18: 0.4 mg via INTRAVENOUS

## 2019-02-21 ENCOUNTER — Telehealth (HOSPITAL_COMMUNITY): Payer: Self-pay | Admitting: Emergency Medicine

## 2019-02-21 NOTE — Telephone Encounter (Signed)
Left message on voicemail with name and callback number Aniqa Hare RN Navigator Cardiac Imaging [facility] [phone] Office [phone] Cell  

## 2019-02-24 ENCOUNTER — Ambulatory Visit (HOSPITAL_COMMUNITY)
Admission: RE | Admit: 2019-02-24 | Discharge: 2019-02-24 | Disposition: A | Discharge status: Home / Self Care | Payer: 59 | Source: Ambulatory Visit | Attending: Cardiology | Admitting: Cardiology

## 2019-02-24 ENCOUNTER — Other Ambulatory Visit: Payer: Self-pay

## 2019-02-24 DIAGNOSIS — R072 Precordial pain: Secondary | ICD-10-CM

## 2019-02-24 LAB — POCT I-STAT CREATININE: Creatinine, Ser: 1 mg/dL (ref 0.61–1.24)

## 2019-02-24 MED ORDER — NITROGLYCERIN 0.4 MG SL SUBL
SUBLINGUAL_TABLET | SUBLINGUAL | Status: AC
  Filled 2019-02-24: qty 2

## 2019-02-24 MED ORDER — IOHEXOL 350 MG/ML SOLN
80.0000 mL | Freq: Once | INTRAVENOUS | Status: AC | PRN
  Administered 2019-02-24: 80 mL via INTRAVENOUS

## 2019-02-24 MED ORDER — NITROGLYCERIN 0.4 MG SL SUBL
0.8000 mg | SUBLINGUAL_TABLET | Freq: Once | SUBLINGUAL | Status: AC
  Administered 2019-02-24: 0.8 mg via SUBLINGUAL

## 2019-02-24 NOTE — Progress Notes (Signed)
Ct complete. Patient denies any complaints. Offered patient snack and drink.  

## 2019-02-27 ENCOUNTER — Encounter: Payer: Self-pay | Admitting: Emergency Medicine

## 2019-02-27 ENCOUNTER — Encounter: Payer: Self-pay | Admitting: Cardiothoracic Surgery

## 2019-02-27 NOTE — Progress Notes (Signed)
Error

## 2019-02-27 NOTE — Telephone Encounter (Signed)
ERROR

## 2019-03-18 ENCOUNTER — Encounter: Payer: Self-pay | Admitting: Cardiology

## 2019-03-18 ENCOUNTER — Other Ambulatory Visit: Payer: Self-pay

## 2019-03-18 ENCOUNTER — Ambulatory Visit (INDEPENDENT_AMBULATORY_CARE_PROVIDER_SITE_OTHER): Payer: 59 | Admitting: Cardiology

## 2019-03-18 VITALS — BP 136/85 | HR 80 | Ht 73.0 in | Wt 180.0 lb

## 2019-03-18 DIAGNOSIS — Z712 Person consulting for explanation of examination or test findings: Secondary | ICD-10-CM

## 2019-03-18 DIAGNOSIS — R0789 Other chest pain: Secondary | ICD-10-CM

## 2019-03-18 DIAGNOSIS — E785 Hyperlipidemia, unspecified: Secondary | ICD-10-CM | POA: Diagnosis not present

## 2019-03-18 DIAGNOSIS — I2510 Atherosclerotic heart disease of native coronary artery without angina pectoris: Secondary | ICD-10-CM

## 2019-03-18 DIAGNOSIS — Z7189 Other specified counseling: Secondary | ICD-10-CM

## 2019-03-18 DIAGNOSIS — Z716 Tobacco abuse counseling: Secondary | ICD-10-CM

## 2019-03-18 DIAGNOSIS — F17210 Nicotine dependence, cigarettes, uncomplicated: Secondary | ICD-10-CM

## 2019-03-18 DIAGNOSIS — Z720 Tobacco use: Secondary | ICD-10-CM

## 2019-03-18 DIAGNOSIS — Z79899 Other long term (current) drug therapy: Secondary | ICD-10-CM

## 2019-03-18 MED ORDER — ATORVASTATIN CALCIUM 20 MG PO TABS: 20.0000 mg | ORAL_TABLET | Freq: Every day | ORAL | 3 refills | Status: DC

## 2019-03-18 MED ORDER — ASPIRIN EC 81 MG PO TBEC: 81.0000 mg | DELAYED_RELEASE_TABLET | Freq: Every day | ORAL | 3 refills | Status: AC

## 2019-03-18 NOTE — Patient Instructions (Addendum)
Medication Instructions:  Start Aspirin 81 mg daily   *If you need a refill on your cardiac medications before your next appointment, please call your pharmacy*  Lab Work: Your physician recommends that you return for lab work in 3 months ( Lipid, LFT)   Testing/Procedures: None  Follow-Up: At [facility], you and your health needs are our priority.  As part of our continuing mission to provide you with exceptional heart care, we have created designated Provider Care Teams.  These Care Teams include your primary Cardiologist (physician) and Advanced Practice Providers (APPs -  Physician Assistants and Nurse Practitioners) who all work together to provide you with the care you need, when you need it.  Your next appointment:   1 year(s)  The format for your next appointment:   In Person  Provider:   Buford Dresser, MD

## 2019-03-18 NOTE — Progress Notes (Signed)
Cardiology Office Note:    Date:  03/18/2019   ID:  Rodney Daniels, DOB 23-Jan-1951, MRN [id number]  PCP:  Waldon MerlMartin, William C, PA-C  Cardiologist:  Jodelle RedBridgette Jupiter Boys, MD PhD  Referring MD: Waldon MerlMartin, William C, PA-C   CC: follow up  History of Present Illness:    Rodney Daniels is a 68 y.o. male with a hx of tobacco use, hyperlipidemia who is seen for follow up today. I initially saw him 12-17-18 as a new consult at the request of Noel JourneyMartin, William C, PA-C for the evaluation and management of chest pain, preoperative evaluation.  Cardiac history: reported several months of both sharp and dull chest pain, occurring up to daily, lasting up to 15 minutes. Has risk factors including tobacco use (>100 pack years), family history (mother-MI) and hyperlipidemia (initial LDL 175). He was initially seen for preop eval prior to colonoscopy.  Today:  Having rare twinges of sharp chest pain, not as frequent or severe as prior. Reviewed all of the CV testing he has had: ETT nondiagnostic. Had been ordered CT coronary but hadn't been scheduled, so ordered for nuclear stress test (which was normal). Ended up being called to schedule CT after he had stress test, so he had CT cardiac performed as well (nonobstructive CAD but reassuring, below). We reviewed all of these results together today.   Still smoking. Discussed quitting today, encouraged him to try. He will try again. Was able to quit successfully for 2 years in the past.  Reviewed medications today. Had a brief prescription for atorvastatin. He trialed atorvastatin, didn't have any issues, just had the prescription run out. Willing to start again.  Denies shortness of breath at rest or with normal exertion. No PND, orthopnea, LE edema or unexpected weight gain. No syncope or palpitations.  Past Medical History:  Diagnosis Date  . Allergy   . History of chicken pox     Past Surgical History:  Procedure Laterality Date  . thumb  surgery      Current Medications: No current outpatient medications on file prior to visit.   No current facility-administered medications on file prior to visit.     Allergies:   Patient has no known allergies.   Social History   Tobacco Use  . Smoking status: Current Every Day Smoker    Packs/day: 1.00    Years: 55.00    Pack years: 55.00    Types: Cigarettes  . Smokeless tobacco: Never Used  Substance Use Topics  . Alcohol use: Yes    Comment: rarely  . Drug use: No    Family History: The patient's family history includes Cancer in his father; Heart disease in his mother.  ROS:   Please see the history of present illness.  Additional pertinent ROS: Constitutional: Negative for chills, fever, night sweats, unintentional weight loss  HENT: Negative for ear pain and hearing loss.   Eyes: Negative for loss of vision and eye pain.  Respiratory: Negative for cough, sputum, wheezing.   Cardiovascular: See HPI. Gastrointestinal: Negative for abdominal pain, melena, and hematochezia.  Genitourinary: Negative for dysuria and hematuria.  Musculoskeletal: Negative for falls and myalgias.  Skin: Negative for itching and rash.  Neurological: Negative for focal weakness, focal sensory changes and loss of consciousness.  Endo/Heme/Allergies: Does not bruise/bleed easily.   EKGs/Labs/Other Studies Reviewed:    The following studies were reviewed today: ETT 01/30/19  The patient walked for 6 minutes on a bruce protocol GXT. For the most part the exercise  ECG in the later stages is uninterpretable.  THe peak HR appears to be at least 146 which is 96% predicted maximal HR. The actual HR at end exercise could not be determined by the ECG due to artifact  The patient developed 1 mm ST depression during the recovery phase which resolved after several minutes.  Non diagnostic GXT due to significant artifact during the treadmill. The patient did develop some ST depression in the  recovery phase which is concerning for ischemia  Suggest alternative evaluation for ischemic evaluation  Nuclear lexiscan 02/17/19  There was no ST segment deviation noted during stress.  The left ventricular ejection fraction is normal (55-65%).  Nuclear stress EF: 58%.  The study is normal.  This is a low risk study.  CT cardiac 02/25/19 FINDINGS: Coronary calcium score: The patient's coronary artery calcium score is 21, which places the patient in the 28 percentile.  Coronary arteries: Normal coronary origins.  Right dominance.  Right Coronary Artery: Normal caliber vessel, gives rise to PDA and PLB. Proximal RCA with trivial calcified plaque and 1-24% stenosis.  Left Main Coronary Artery: Normal caliber vessel. No significant plaque or stenosis.  Left Anterior Descending Coronary Artery: Normal caliber vessel. Trivial calcified plaque in proximal LAD with 1-24% stenosis. Gives rise to 2 diagonal branches.  Left Circumflex Artery: Normal caliber vessel. No significant plaque or stenosis. Gives rise to 1 large OM branch.  Aorta: Normal size, 35 mm at the mid ascending aorta (level of the PA bifurcation) measured double oblique. Mild calcifications. No dissection.  Aortic Valve: No calcifications. Trileaflet.  Other findings: Normal pulmonary vein drainage into the left atrium. Normal left atrial appendage without a thrombus. Normal size of the pulmonary artery.  IMPRESSION: 1. Minimal CAD, CADRADS = 1. 2. Coronary calcium score of 21. This was 28th percentile for age and sex matched control. 3. Normal coronary origin with right dominance.  EKG:  EKG is personally reviewed.  The ekg ordered 12/17/18 demonstrates NSR, no R in V1/V2  Recent Labs: 10/25/2018: BUN 14; Hemoglobin 16.0; Platelets 204.0; Potassium 4.5; Sodium 140 12/17/2018: ALT 12 02/24/2019: Creatinine, Ser 1.00  Recent Lipid Panel    Component Value Date/Time   CHOL 169 12/17/2018  1443   TRIG 79.0 12/17/2018 1443   HDL 44.50 12/17/2018 1443   CHOLHDL 4 12/17/2018 1443   VLDL 15.8 12/17/2018 1443   LDLCALC 108 (H) 12/17/2018 1443   LDLDIRECT 208.3 09/19/2011 1349    Physical Exam:    VS:  BP 136/85   Pulse 80   Ht  (1.854 m)   Wt 180 lb (81.6 kg)   SpO2 98%   BMI 23.75 kg/m     Wt Readings from Last 3 Encounters:  03/18/19 180 lb (81.6 kg)  02/17/19 175 lb (79.4 kg)  12/17/18 178 lb (80.7 kg)    GEN: Well nourished, well developed in no acute distress HEENT: Normal, moist mucous membranes NECK: No JVD CARDIAC: regular rhythm, normal S1 and S2, no rubs or gallops. No murmur. VASCULAR: Radial and DP pulses 2+ bilaterally. No carotid bruits RESPIRATORY:  Clear to auscultation without rales, wheezing or rhonchi  ABDOMEN: Soft, non-tender, non-distended MUSCULOSKELETAL:  Ambulates independently SKIN: Warm and dry, no edema NEUROLOGIC:  Alert and oriented x 3. No focal neuro deficits noted. PSYCHIATRIC:  Normal affect   ASSESSMENT:    1. Encounter to discuss test results   2. Dyslipidemia   3. Nonobstructive atherosclerosis of coronary artery   4. Medication management  5. Tobacco abuse   6. Tobacco abuse counseling   7. Other chest pain   8. Cardiac risk counseling   9. Counseling on health promotion and disease prevention    PLAN:    Atypical chest pain: low risk based on multiple cardiac testing modalities. Test results reviewed together today. -acceptable risk for future procedures/surgery at this time  Nonobstructive CAD: as seen on CT cardiac -restart atorvastatin today -start aspirin 81 mg -had microscopic blood on FOBT, hence trigger for colonoscopy. Counseled to monitor for melena/hematochezia (which he denies currently) with start of aspirin  Hypercholesterolemia: Initial LDL 175, improved on atorvastatin but not currently taking as prescription expired -restart atorvastatin -goal LDL with nonobstructive CAD <70  Tobacco  use: The patient was counseled on tobacco cessation today for 4 minutes.  Counseling included reviewing the risks of smoking tobacco products, how it impacts the patient's current medical diagnoses and different strategies for quitting.  Pharmacotherapy to aid in tobacco cessation was not prescribed today.  Cardiac risk counseling and prevention recommendations: -recommend heart healthy/Mediterranean diet, with whole grains, fruits, vegetable, fish, lean meats, nuts, and olive oil. Limit salt. -recommend moderate walking, 3-5 times/week for 30-50 minutes each session. Aim for at least 150 minutes.week. Goal should be pace of 3 miles/hours, or walking 1.5 miles in 30 minutes -recommend avoidance of tobacco products. Avoid excess alcohol. -Additional risk factor control:  -Diabetes: A1c is 5.5, no history  -Lipids: as above  -Blood pressure control: slightly elevated but no history, on no meds. Monitor.  -Weight: BMI 23, normal weight. -ASCVD risk score: The 10-year ASCVD risk score Mikey Bussing DC Brooke Bonito., et al., 2013) is: 21.2%   Values used to calculate the score:     Age: 41 years     Sex: Male     Is Non-Hispanic African American: No     Diabetic: No     Tobacco smoker: Yes     Systolic Blood Pressure: 381 mmHg     Is BP treated: No     HDL Cholesterol: 44.5 mg/dL     Total Cholesterol: 169 mg/dL    Plan for follow up: lipids/LFTs in 3 mos, follow up in 1 year or sooner PRN  Medication Adjustments/Labs and Tests Ordered: Current medicines are reviewed at length with the patient today.  Concerns regarding medicines are outlined above.  Orders Placed This Encounter  Procedures  . Lipid panel  . Hepatic function panel   Meds ordered this encounter  Medications  . aspirin EC 81 MG tablet    Sig: Take 1 tablet (81 mg total) by mouth daily.    Dispense:  90 tablet    Refill:  3  . atorvastatin (LIPITOR) 20 MG tablet    Sig: Take 1 tablet (20 mg total) by mouth daily.    Dispense:  90  tablet    Refill:  3    Patient Instructions  Medication Instructions:  Start Aspirin 81 mg daily   *If you need a refill on your cardiac medications before your next appointment, please call your pharmacy*  Lab Work: Your physician recommends that you return for lab work in 3 months ( Lipid, LFT)   Testing/Procedures: None  Follow-Up: At [facility], you and your health needs are our priority.  As part of our continuing mission to provide you with exceptional heart care, we have created designated Provider Care Teams.  These Care Teams include your primary Cardiologist (physician) and Advanced Practice Providers (APPs -  Physician Assistants  and Nurse Practitioners) who all work together to provide you with the care you need, when you need it.  Your next appointment:   1 year(s)  The format for your next appointment:   In Person  Provider:   Jodelle Red, MD      Signed, Jodelle Red, MD PhD 03/18/2019  [facility]

## 2019-05-26 ENCOUNTER — Ambulatory Visit: Payer: 59 | Attending: Internal Medicine

## 2019-05-26 DIAGNOSIS — Z23 Encounter for immunization: Secondary | ICD-10-CM

## 2019-05-26 NOTE — Progress Notes (Signed)
   Covid-19 Vaccination Clinic  Name:  BERKLEY CRONKRIGHT    MRN: [id number] DOB: 05/15/1950  05/26/2019  Mr. Rho was observed post Covid-19 immunization for 15 minutes without incidence. He was provided with Vaccine Information Sheet and instruction to access the V-Safe system.   Mr. Dumond was instructed to call 911 with any severe reactions post vaccine: [name] Kitchen Difficulty breathing  . Swelling of your face and throat  . A fast heartbeat  . A bad rash all over your body  . Dizziness and weakness    Immunizations Administered    Name Date Dose VIS Date Route   Pfizer COVID-19 Vaccine 05/26/2019  3:04 PM 0.3 mL 03/07/2019 Intramuscular   Manufacturer: [employer], [employer]   Lot: [id number]   NDC: [address]-6

## 2019-06-18 ENCOUNTER — Ambulatory Visit: Payer: 59 | Attending: Internal Medicine

## 2019-06-18 DIAGNOSIS — Z23 Encounter for immunization: Secondary | ICD-10-CM

## 2019-06-18 NOTE — Progress Notes (Signed)
   Covid-19 Vaccination Clinic  Name:  Rodney Daniels    MRN: [id number] DOB: 08-04-1950  06/18/2019  [name] was observed post Covid-19 immunization for 15 minutes without incident. He was provided with Vaccine Information Sheet and instruction to access the V-Safe system.   [name] was instructed to call 911 with any severe reactions post vaccine: [name] Kitchen Difficulty breathing  . Swelling of face and throat  . A fast heartbeat  . A bad rash all over body  . Dizziness and weakness   Immunizations Administered    Name Date Dose VIS Date Route   Pfizer COVID-19 Vaccine 06/18/2019  2:26 PM 0.3 mL 03/07/2019 Intramuscular   Manufacturer: [employer], [employer]   Lot: [id number]   NDC: [address]-6

## 2019-11-11 ENCOUNTER — Telehealth: Payer: Self-pay | Admitting: Physician Assistant

## 2019-11-11 DIAGNOSIS — Z122 Encounter for screening for malignant neoplasm of respiratory organs: Secondary | ICD-10-CM

## 2019-11-11 NOTE — Telephone Encounter (Signed)
Patient due for annual lung cancer screening. Order placed. He will be contacted to schedule. Please remind patient of this.

## 2019-11-11 NOTE — Telephone Encounter (Signed)
Called patient. Patient is aware of annual lung cancer screening.

## 2019-11-24 ENCOUNTER — Ambulatory Visit
Admission: RE | Admit: 2019-11-24 | Discharge: 2019-11-24 | Disposition: A | Discharge status: Home / Self Care | Payer: 59 | Source: Ambulatory Visit | Attending: Physician Assistant | Admitting: Physician Assistant

## 2019-11-24 DIAGNOSIS — Z122 Encounter for screening for malignant neoplasm of respiratory organs: Secondary | ICD-10-CM

## 2020-01-01 ENCOUNTER — Telehealth: Payer: Self-pay | Admitting: Cardiology

## 2020-01-01 NOTE — Telephone Encounter (Signed)
lvm for patient to return call to get follow up scheduled with Christopher from recall list 

## 2020-11-03 IMAGING — CT CT CHEST LUNG CANCER SCREENING LOW DOSE
1 of 3 series · 10 of 30 positions shown, 13 images · non-contrast
Comparison: None

CLINICAL DATA: Lung cancer screening. Fifty-five pack-year history.
Current asymptomatic smoker.

EXAM:
CT CHEST WITHOUT CONTRAST LOW-DOSE FOR LUNG CANCER SCREENING
TECHNIQUE: Multidetector CT imaging of the chest was performed following the
standard protocol without IV contrast.

[ct lung segmentation data · axial · 0.78mm/px · z∈[-404,-404]mm · 10 of 367 frames shown]
[frame 1/367  mediastinal]
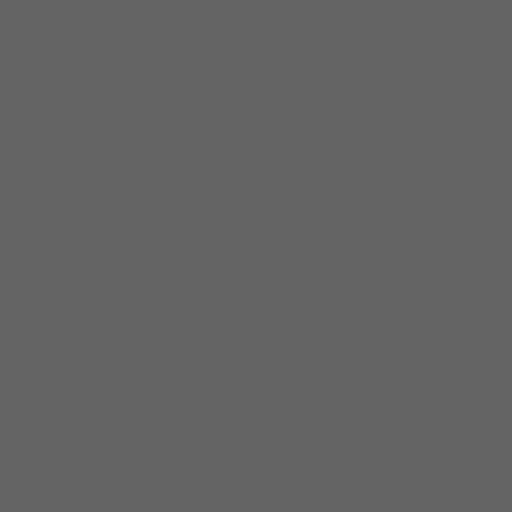
[frame 1/367  lung]
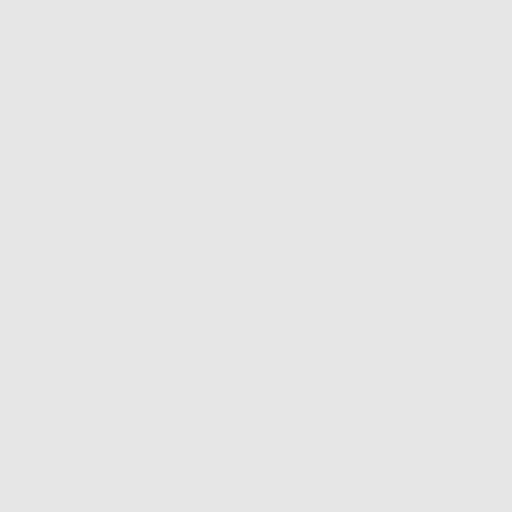
[frame 41/367  lung]
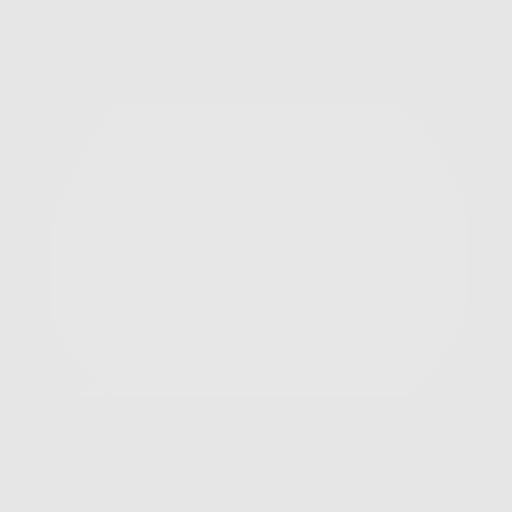
[frame 82/367  lung]
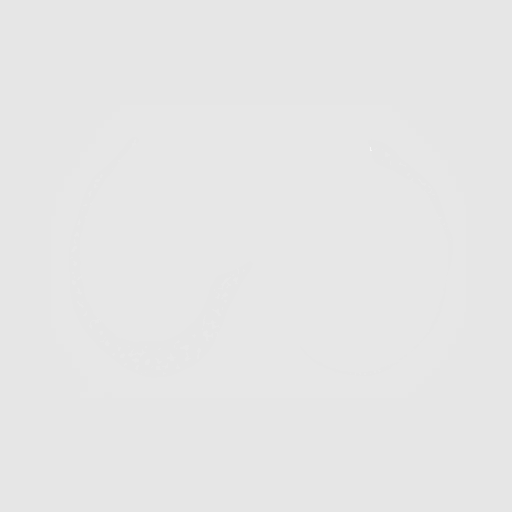
[frame 123/367  lung]
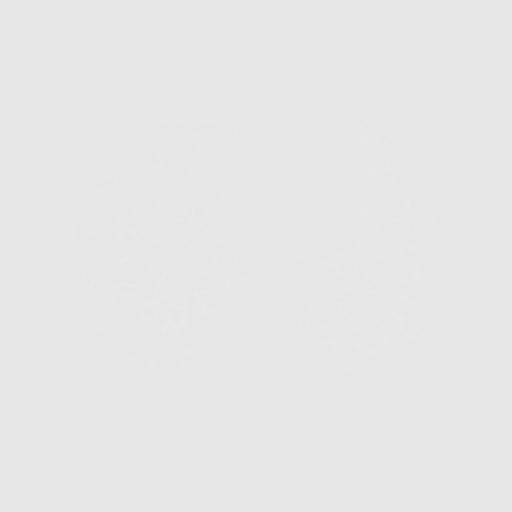
[frame 163/367  mediastinal]
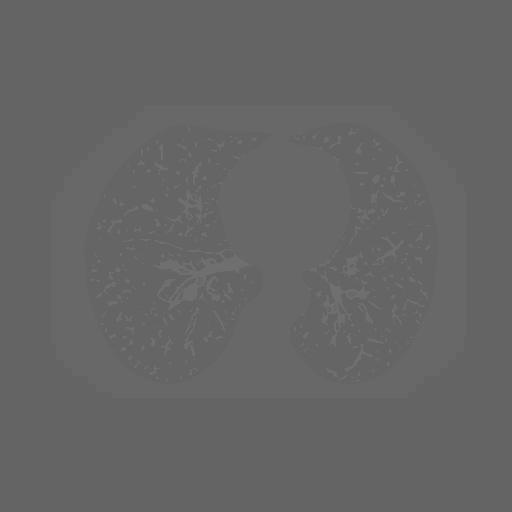
[frame 163/367  lung]
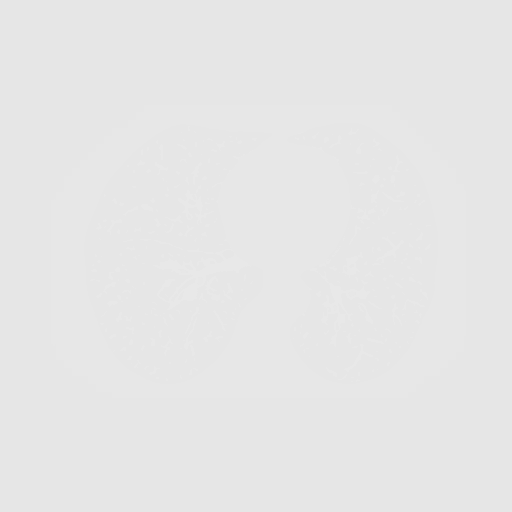
[frame 204/367  lung]
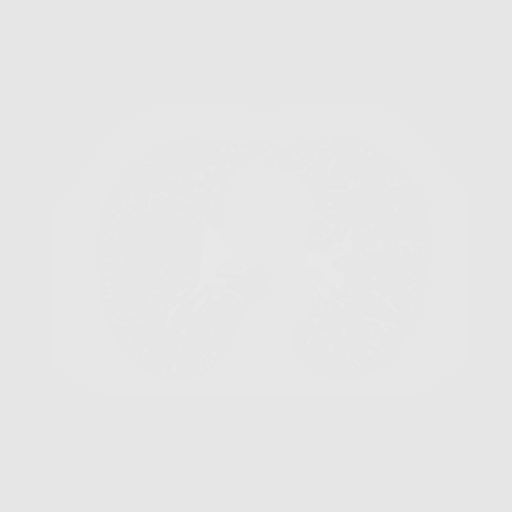
[frame 245/367  lung]
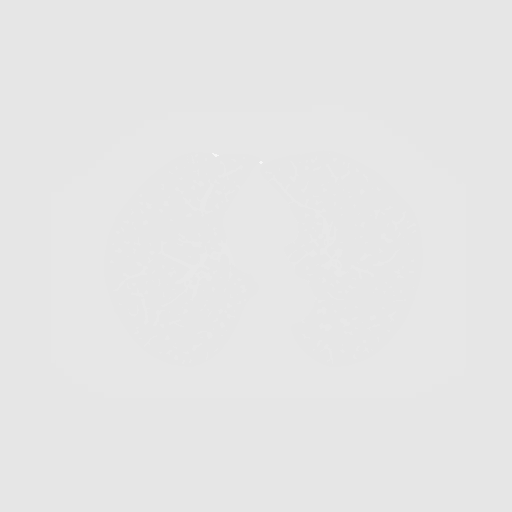
[frame 285/367  lung]
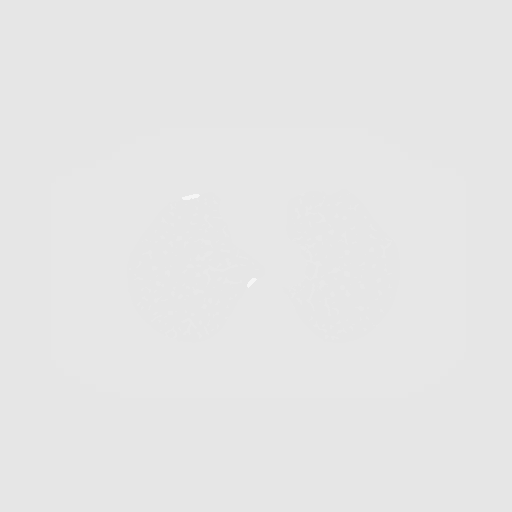
[frame 326/367  mediastinal]
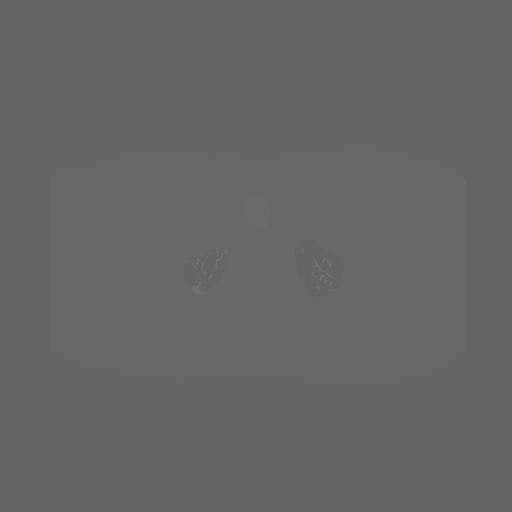
[frame 326/367  lung]
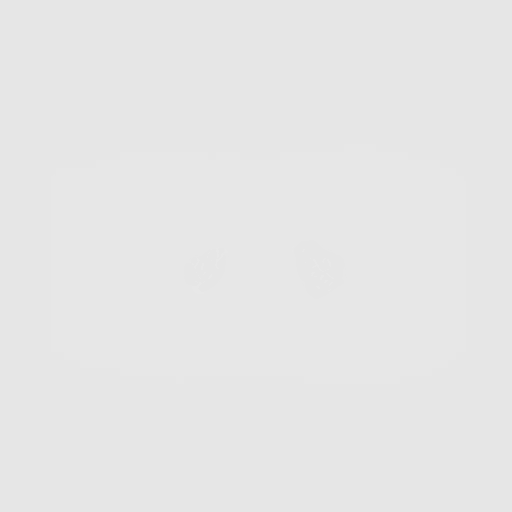
[frame 367/367  lung]
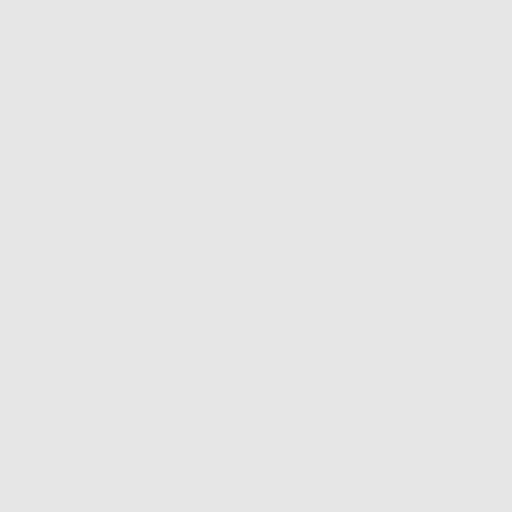

[10 of 30 positions shown; findings below may reference images not displayed]

FINDINGS: Cardiovascular: The heart size is normal. No pericardial effusion.
Aortic atherosclerosis.

Mediastinum/Nodes: No enlarged mediastinal, hilar, or axillary lymph
nodes. Thyroid gland, trachea, and esophagus demonstrate no
significant findings.

Lungs/Pleura: Mild changes of emphysema. There is no pleural
effusion identified. No airspace consolidation or pneumothorax. A
few small solid pulmonary nodules are identified. The largest is in
the perifissural right upper lobe with an equivalent diameter of
mm.

Upper Abdomen: No acute abnormality.

Musculoskeletal: No chest wall mass or suspicious bone lesions
identified.
IMPRESSION: 1. Lung-RADS 2, benign appearance or behavior. Continue annual
screening with low-dose chest CT without contrast in 12 months.
2. Aortic Atherosclerosis (D0MA4-GZX.X) and Emphysema (D0MA4-9EG.3).

## 2024-04-01 ENCOUNTER — Emergency Department (HOSPITAL_COMMUNITY)

## 2024-04-01 ENCOUNTER — Encounter (HOSPITAL_COMMUNITY): Payer: Self-pay

## 2024-04-01 ENCOUNTER — Observation Stay (HOSPITAL_COMMUNITY)
Admission: EM | Admit: 2024-04-01 | Discharge: 2024-04-02 | Disposition: A | Discharge status: Home / Self Care | Payer: Worker's Compensation | Attending: Internal Medicine | Admitting: Internal Medicine

## 2024-04-01 ENCOUNTER — Emergency Department (HOSPITAL_COMMUNITY): Payer: Worker's Compensation

## 2024-04-01 DIAGNOSIS — S0993XA Unspecified injury of face, initial encounter: Secondary | ICD-10-CM

## 2024-04-01 DIAGNOSIS — E785 Hyperlipidemia, unspecified: Secondary | ICD-10-CM | POA: Diagnosis not present

## 2024-04-01 DIAGNOSIS — S0081XA Abrasion of other part of head, initial encounter: Secondary | ICD-10-CM | POA: Insufficient documentation

## 2024-04-01 DIAGNOSIS — W19XXXA Unspecified fall, initial encounter: Secondary | ICD-10-CM | POA: Diagnosis not present

## 2024-04-01 DIAGNOSIS — R7989 Other specified abnormal findings of blood chemistry: Secondary | ICD-10-CM | POA: Diagnosis not present

## 2024-04-01 DIAGNOSIS — F17210 Nicotine dependence, cigarettes, uncomplicated: Secondary | ICD-10-CM | POA: Diagnosis not present

## 2024-04-01 DIAGNOSIS — R195 Other fecal abnormalities: Secondary | ICD-10-CM | POA: Insufficient documentation

## 2024-04-01 DIAGNOSIS — R55 Syncope and collapse: Secondary | ICD-10-CM

## 2024-04-01 DIAGNOSIS — Z720 Tobacco use: Secondary | ICD-10-CM | POA: Diagnosis present

## 2024-04-01 DIAGNOSIS — J45909 Unspecified asthma, uncomplicated: Secondary | ICD-10-CM | POA: Diagnosis not present

## 2024-04-01 DIAGNOSIS — I2510 Atherosclerotic heart disease of native coronary artery without angina pectoris: Secondary | ICD-10-CM | POA: Diagnosis not present

## 2024-04-01 DIAGNOSIS — R739 Hyperglycemia, unspecified: Secondary | ICD-10-CM

## 2024-04-01 LAB — CBC
HCT: 43.8 % (ref 39.0–52.0)
Hemoglobin: 15 g/dL (ref 13.0–17.0)
MCH: 30 pg (ref 26.0–34.0)
MCHC: 34.2 g/dL (ref 30.0–36.0)
MCV: 87.6 fL (ref 80.0–100.0)
Platelets: 247 K/uL (ref 150–400)
RBC: 5 MIL/uL (ref 4.22–5.81)
RDW: 12.1 % (ref 11.5–15.5)
WBC: 15.4 K/uL — ABNORMAL HIGH (ref 4.0–10.5)
nRBC: 0 % (ref 0.0–0.2)

## 2024-04-01 LAB — BASIC METABOLIC PANEL WITH GFR
Anion gap: 11 (ref 5–15)
BUN: 17 mg/dL (ref 8–23)
CO2: 24 mmol/L (ref 22–32)
Calcium: 8.8 mg/dL — ABNORMAL LOW (ref 8.9–10.3)
Chloride: 102 mmol/L (ref 98–111)
Creatinine, Ser: 1.04 mg/dL (ref 0.61–1.24)
GFR, Estimated: >60 mL/min
Glucose, Bld: 199 mg/dL — ABNORMAL HIGH (ref 70–99)
Potassium: 3.4 mmol/L — ABNORMAL LOW (ref 3.5–5.1)
Sodium: 137 mmol/L (ref 135–145)

## 2024-04-01 LAB — TROPONIN T, HIGH SENSITIVITY
Troponin T High Sensitivity: 42 ng/L — ABNORMAL HIGH (ref 0–19)
Troponin T High Sensitivity: 63 ng/L — ABNORMAL HIGH (ref 0–19)
Troponin T High Sensitivity: 49 ng/L — ABNORMAL HIGH (ref 0–19)
Troponin T High Sensitivity: 39 ng/L — ABNORMAL HIGH (ref 0–19)

## 2024-04-01 LAB — ETHANOL: Alcohol, Ethyl (B): <15 mg/dL

## 2024-04-01 LAB — MAGNESIUM: Magnesium: 2 mg/dL (ref 1.7–2.4)

## 2024-04-01 LAB — TSH: TSH: 1.02 u[IU]/mL (ref 0.350–4.500)

## 2024-04-01 MED ORDER — POLYETHYLENE GLYCOL 3350 17 G PO PACK: 17.0000 g | PACK | Freq: Every day | ORAL | Status: DC | PRN

## 2024-04-01 MED ORDER — ACETAMINOPHEN 325 MG PO TABS: 650.0000 mg | ORAL_TABLET | Freq: Four times a day (QID) | ORAL | Status: DC | PRN

## 2024-04-01 MED ORDER — ENOXAPARIN SODIUM 40 MG/0.4ML IJ SOSY
40.0000 mg | PREFILLED_SYRINGE | INTRAMUSCULAR | Status: DC
  Administered 2024-04-01: 40 mg via SUBCUTANEOUS
  Filled 2024-04-01 (×2): qty 0.4

## 2024-04-01 MED ORDER — KETOROLAC TROMETHAMINE 30 MG/ML IJ SOLN
30.0000 mg | Freq: Once | INTRAMUSCULAR | Status: AC
  Administered 2024-04-01: 30 mg via INTRAVENOUS
  Filled 2024-04-01: qty 1

## 2024-04-01 MED ORDER — ONDANSETRON HCL 4 MG/2ML IJ SOLN
4.0000 mg | Freq: Once | INTRAMUSCULAR | Status: AC
  Administered 2024-04-01: 4 mg via INTRAVENOUS
  Filled 2024-04-01: qty 2

## 2024-04-01 MED ORDER — POTASSIUM CHLORIDE CRYS ER 20 MEQ PO TBCR
40.0000 meq | EXTENDED_RELEASE_TABLET | Freq: Once | ORAL | Status: AC
  Administered 2024-04-01: 40 meq via ORAL
  Filled 2024-04-01: qty 2

## 2024-04-01 MED ORDER — ASPIRIN 81 MG PO TBEC
81.0000 mg | DELAYED_RELEASE_TABLET | Freq: Every day | ORAL | Status: DC
  Administered 2024-04-01 – 2024-04-02 (×2): 81 mg via ORAL
  Filled 2024-04-01 (×2): qty 1

## 2024-04-01 MED ORDER — ACETAMINOPHEN 650 MG RE SUPP: 650.0000 mg | Freq: Four times a day (QID) | RECTAL | Status: DC | PRN

## 2024-04-01 MED ORDER — ATORVASTATIN CALCIUM 40 MG PO TABS
40.0000 mg | ORAL_TABLET | Freq: Every day | ORAL | Status: DC
  Administered 2024-04-01 – 2024-04-02 (×2): 40 mg via ORAL
  Filled 2024-04-01 (×2): qty 1

## 2024-04-01 MED ORDER — IOHEXOL 350 MG/ML SOLN
75.0000 mL | Freq: Once | INTRAVENOUS | Status: AC | PRN
  Administered 2024-04-01: 75 mL via INTRAVENOUS

## 2024-04-01 MED ORDER — TETANUS-DIPHTH-ACELL PERTUSSIS 5-2-15.5 LF-MCG/0.5 IM SUSP
0.5000 mL | Freq: Once | INTRAMUSCULAR | Status: AC
  Administered 2024-04-01: 0.5 mL via INTRAMUSCULAR
  Filled 2024-04-01: qty 0.5

## 2024-04-01 MED ORDER — AMLODIPINE BESYLATE 5 MG PO TABS
5.0000 mg | ORAL_TABLET | Freq: Every day | ORAL | Status: DC
  Administered 2024-04-01 – 2024-04-02 (×2): 5 mg via ORAL
  Filled 2024-04-01 (×2): qty 1

## 2024-04-01 MED ORDER — LIDOCAINE-EPINEPHRINE (PF) 2 %-1:200000 IJ SOLN
10.0000 mL | Freq: Once | INTRAMUSCULAR | Status: DC
  Filled 2024-04-01: qty 20

## 2024-04-01 NOTE — ED Notes (Signed)
 Patient's son, Selinda, would like to be called with any changes in patient status and when patient gets a bed.  [phone]

## 2024-04-01 NOTE — ED Triage Notes (Signed)
 Pt BIB [facility] from work, syncopal episode pt called coworker, when coworker arrived pt was down on the floor, unknown time of how long he was down. C-collar in place, large laceration to top of his head. Initially confused upon EMS arrival   142/90 HR 80 98% room air  CBG 193

## 2024-04-01 NOTE — ED Notes (Signed)
 Pt denies any pain at this time from the injury. Only complains if the c-collar hurting his neck and head.

## 2024-04-01 NOTE — ED Notes (Signed)
 Did manual blood pressure got patient on the monitor got patient undressed into a gown patient is getting a x ray now patient has call bell in reach

## 2024-04-01 NOTE — Consult Note (Addendum)
 "  Cardiology Consultation   Patient ID: Rodney Daniels MRN: [id number]; DOB: Jan 16, 1951  Admit date: 04/01/2024 Date of Consult: 04/01/2024  PCP:  Rodney Daniels   [facility] Providers Cardiologist:  Shelda Bruckner, MD        Patient Profile: Rodney Daniels is a 74 y.o. male with a hx of mild CAD, tobacco use and hyperlipidemia who is being seen 04/01/2024 for the evaluation of syncope at the request of Rodney Eastern DO.  History of Present Illness: Rodney Daniels was previously seen by cardiology in 2020 by Dr. Bruckner for chest pain. A CCTA was pursued at that time which showed non-obstructive CAD. Ca score 21. Normal stress imaging. It was felt his chest pain was atypical and not cardiac in etiology. He was lost to cardiology follow up.  Presented to the ED after a syncopal episode.  In the ED:  BP: 151/91 ECG: sinus rhythm VR 88 CXR without acute process CT imaging showed left forehead soft tissue injury with no acute intracranial process. Negative for Cspine injury. Normal vessels.   Pertinent lab work:  K 3.4  Glucose 199 WBC 15.4 Troponin 42 -> 63, repeat pending  Patient has received pain medication and zofran  in the ED. On interview, patient shared he was completely asymptomatic prior and after his syncopal episode. Denied chest pain, palpitations, shortness of breath, lightheadedness, dizziness. Has never had a syncopal episode before today. No family history of SCD. Daughter did have an ablation, though unsure the EP problem that prompted it, she is alive and well.  Denied anginal chest pain or equivalent.  Did report he thinks he has bronchitis, has had a productive cough for several weeks now.  Smokes a pack of cigs a day for > 60 years.  Drinks two standard whiskey drinks an evening.  Denied other illicit drugs Was not taking medication prior to this admission.  Does not check his BP at home.    Past Medical History:  Diagnosis Date    Allergy    History of chicken pox     Past Surgical History:  Procedure Laterality Date   thumb surgery         Scheduled Meds:  enoxaparin  (LOVENOX ) injection  40 mg Subcutaneous Q24H   potassium chloride   40 mEq Oral Once   Continuous Infusions:  PRN Meds: acetaminophen  **OR** acetaminophen , polyethylene glycol  Allergies:   Allergies[1]  Social History:   Social History   Socioeconomic History   Marital status: Widowed    Spouse name: Not on file   Number of children: Not on file   Years of education: Not on file   Highest education level: Not on file  Occupational History   Not on file  Tobacco Use   Smoking status: Every Day    Current packs/day: 1.00    Average packs/day: 1 pack/day for 55.0 years (55.0 ttl pk-yrs)    Types: Cigarettes   Smokeless tobacco: Never  Vaping Use   Vaping status: Never Used  Substance and Sexual Activity   Alcohol use: Yes    Comment: rarely   Drug use: No   Sexual activity: Not Currently  Other Topics Concern   Not on file  Social History Narrative   Not on file   Social Drivers of Health   Tobacco Use: High Risk (04/01/2024)   Patient History    Smoking Tobacco Use: Every Day    Smokeless Tobacco Use: Never    Passive Exposure: Not on file  Financial Resource Strain: Not on file  Food Insecurity: Not on file  Transportation Needs: Not on file  Physical Activity: Not on file  Stress: Not on file  Social Connections: Not on file  Intimate Partner Violence: Not on file  Depression (EYV7-0): Not on file  Alcohol Screen: Not on file  Housing: Not on file  Utilities: Not on file  Health Literacy: Not on file    Family History:   Family History  Problem Relation Age of Onset   Heart disease Mother    Cancer Father        lung     ROS:  Please see the history of present illness.  All other ROS reviewed and negative.     Physical Exam/Data: Vitals:   04/01/24 0758 04/01/24 0818 04/01/24 1100 04/01/24 1100   BP: (!) 151/91  (!) 138/94   Pulse: 91  91   Resp: 18  17   Temp:    (!) 97.4 F (36.3 C)  TempSrc:    Oral  SpO2: 97% 97% 98%    No intake or output data in the 24 hours ending 04/01/24 1534    03/18/2019    4:19 PM 02/17/2019   10:18 AM 12/17/2018    9:32 AM  Last 3 Weights  Weight (lbs) 180 lb 175 lb 178 lb  Weight (kg) 81.647 kg 79.379 kg 80.74 kg     There is no height or weight on file to calculate BMI.  General:  Pleasant older gentleman in no acute distress HEENT: normal Neck: + JVD Vascular: Distal pulses 2+ bilaterally Cardiac:  normal S1, S2; RRR; no murmur  Lungs:  clear to auscultation bilaterally, no wheezing, rhonchi or rales  Abd: soft, nontender, no hepatomegaly  Ext: no edema Skin: warm and dry  Psych:  Normal affect   EKG:  The EKG was personally reviewed and demonstrates:  See HPI Telemetry:  Telemetry was personally reviewed and demonstrates:  Patient was being attached to telemetry during interview  Relevant CV Studies:  CCTA 02/2019 FINDINGS: Coronary calcium  score: The patient's coronary artery calcium  score is 21, which places the patient in the 28 percentile.  Right Coronary Artery: Normal caliber vessel, gives rise to PDA and PLB. Proximal RCA with trivial calcified plaque and 1-24% stenosis.   Left Main Coronary Artery: Normal caliber vessel. No significant plaque or stenosis.   Left Anterior Descending Coronary Artery: Normal caliber vessel. Trivial calcified plaque in proximal LAD with 1-24% stenosis. Gives rise to 2 diagonal branches.   Left Circumflex Artery: Normal caliber vessel. No significant plaque or stenosis. Gives rise to 1 large OM branch.   Aorta: Normal size, 35 mm at the mid ascending aorta (level of the PA bifurcation) measured double oblique. Mild calcifications. No dissection.  NM 01/2019 There was no ST segment deviation noted during stress. The left ventricular ejection fraction is normal (55-65%). Nuclear  stress EF: 58%. The study is normal. This is a low risk study.    Laboratory Data: High Sensitivity Troponin:   Recent Labs  Lab 04/01/24 0859 04/01/24 0952  TRNPT 42* 63*      Chemistry Recent Labs  Lab 04/01/24 0859  NA 137  K 3.4*  CL 102  CO2 24  GLUCOSE 199*  BUN 17  CREATININE 1.04  CALCIUM  8.8*  GFRNONAA >60  ANIONGAP 11    Hematology Recent Labs  Lab 04/01/24 0859  WBC 15.4*  RBC 5.00  HGB 15.0  HCT 43.8  MCV 87.6  MCH 30.0  MCHC 34.2  RDW 12.1  PLT 247    Radiology/Studies:  CT Angio Neck W and/or Wo Contrast Result Date: 04/01/2024 CLINICAL DATA:  Syncopal episode, confusion EXAM: CT ANGIOGRAPHY NECK TECHNIQUE: Multidetector CT imaging of the neck was performed using the standard protocol during bolus administration of intravenous contrast. Multiplanar CT image reconstructions and MIPs were obtained to evaluate the vascular anatomy. Carotid stenosis measurements (when applicable) are obtained utilizing NASCET criteria, using the distal internal carotid diameter as the denominator. RADIATION DOSE REDUCTION: This exam was performed according to the departmental dose-optimization program which includes automated exposure control, adjustment of the mA and/or kV according to patient size and/or use of iterative reconstruction technique. CONTRAST:  75mL OMNIPAQUE  IOHEXOL  350 MG/ML SOLN COMPARISON:  None Available. CTA NECK: CTA NECK Aortic arch: No proximal vessel stenosis. Right carotid: Normal Left carotid: Normal Right vertebral: Normal Left vertebral: Normal Soft tissues: No significant abnormality Other comments: The intracranial vessels were included on this study. The intracranial vertebral arteries and basilar artery are normal. IMPRESSION: Normal Electronically Signed   By: Nancyann Burns M.D.   On: 04/01/2024 10:30   CT Cervical Spine Wo Contrast Result Date: 04/01/2024 EXAM: CT CERVICAL SPINE WITHOUT CONTRAST 04/01/2024 91:71:71 AM TECHNIQUE: CT of the  cervical spine was performed without the administration of intravenous contrast. Multiplanar reformatted images are provided for review. Automated exposure control, iterative reconstruction, and/or weight based adjustment of the mA/kV was utilized to reduce the radiation dose to as low as reasonably achievable. COMPARISON: CT head 04/01/2024, CT chest 11/07/2018. CLINICAL HISTORY: 74 year old male with multiple traumatic injuries, blunt, status post unobserved fall, found down at work. FINDINGS: BONES AND ALIGNMENT: No acute fracture or traumatic malalignment. Mild straightening of cervical lordosis. DEGENERATIVE CHANGES: Degenerative ankylosis of the right sided C2-C3 facets. Widespread chronic disc and endplate degeneration including degenerative endplate spurring, sclerosis, and small vacuum containing subchondral cysts. Asymmetric chronic right sided C1-C2 degeneration including degenerative right lateral C1 subchondral cyst. SOFT TISSUES: No prevertebral soft tissue swelling. Evidence of chronic cervical vertebral artery tortuosity. Mild postinflammatory right tonsillar pillar calcifications. Mild calcified atherosclerosis at the right carotid bifurcation. Otherwise negative visible non-contrast neck soft tissues. Mild apical lung scarring. Otherwise negative visible non-contrast thoracic inlet. IMPRESSION: 1. No acute traumatic injury identified in the cervical spine. 2. 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 Electronically signed by: Helayne Hurst MD 04/01/2024 08:38 AM EST RP Workstation: [id number]   CT Head Wo Contrast Result Date: 04/01/2024 EXAM: CT HEAD WITHOUT 04/01/2024 08:28:28 AM TECHNIQUE: CT of the head was performed without the administration of intravenous contrast. Automated exposure control, iterative reconstruction, and/or weight based adjustment of the mA/kV was utilized to reduce the radiation dose to as low as reasonably  achievable. COMPARISON: CT head 08/10/2005. CLINICAL HISTORY: 74 year old male status post unobserved fall, found down at work. FINDINGS: BRAIN AND VENTRICLES: No acute intracranial hemorrhage. No mass effect or midline shift. No extra-axial fluid collection. No evidence of acute infarct. No hydrocephalus. Normal brain volume. Normal for age gray white differentiation. Calcified atherosclerosis at the skull base. No suspicious intracranial vascular hyperdensity. ORBITS: No acute abnormality. SINUSES AND MASTOIDS: Mild to moderate bilateral paranasal sinus mucosal thickening with some bubbly opacity, generally appears inflammatory although hyperdense material in the left frontal sinus. No visible facial bone fracture. Tympanic cavities and mastoids appear clear. SOFT TISSUES AND SKULL: No acute skull fracture. Left anterior superior forehead mild scalp soft tissue swelling with evidence of skin laceration.  Small chronic dystrophic soft tissue calcification there was present in 2007. IMPRESSION: 1. Left forehead soft tissue injury. 2. Normal for age non-contrast CT appearance of the brain. 3. Paranasal sinus inflammation; trace hemorrhage in the left frontal sinus is felt less likely. Electronically signed by: Helayne Hurst MD 04/01/2024 08:34 AM EST RP Workstation: [id number]   DG Chest Portable 1 View Result Date: 04/01/2024 EXAM: 1 VIEW(S) XRAY OF THE CHEST 04/01/2024 08:20:27 AM COMPARISON: Chest CT 11/24/2019 and earlier. CLINICAL HISTORY: 74 year old male. Near syncope evaluation. FINDINGS: LUNGS AND PLEURA: Mildly lower lung volumes with mild crowding of markings. No pleural effusion. No pneumothorax. HEART AND MEDIASTINUM: No acute abnormality of the cardiac and mediastinal silhouettes. BONES AND SOFT TISSUES: No acute osseous abnormality. IMPRESSION: 1. No acute cardiopulmonary abnormality. Electronically signed by: Helayne Hurst MD 04/01/2024 08:25 AM EST RP Workstation: [id number]     Assessment and  Plan:  Syncope Reports no prodromal symptoms. Had not ate/drank prior though normal for him.  Head imaging unrevealing ECG without acute ischemic findings Troponin minimally elevated, and flat Echocardiogram pending  Recommend following telemetry overnight to assess for any arrhthymias though do not suspect as the cause. Most likely outpatient cardiac monitor at discharge. At this time do not suspect ACS. Would hold off on IV heparin. Follow up on echocardiogram to assess for any structural etiology. See CAD below.   CAD Mild, non-obstructive in 2020 Denied chest pain or anginal equivalent.  Patient has risk factors of known mild CAD not on medical therapy, ongoing tobacco use, and age. Will follow up on echocardiogram to assess for RWMA. If noted would pursue cardiac catheterization. If not will make cardiology follow up for possible outpatient coronary evaluation. Will hold on starting antianginals for now given asymptomatic.   Start ASA 81 mg    Hyperlipidemia Lipid panel pending Start lipitor 40 mg  Hypertension BP: 138/94 Start amlodipine  5 mg   Per primary Leukocytosis Tobacco use Positive cologuard with no follow up colonscopy Reactive airway disease  Risk Assessment/Risk Scores:      For questions or updates, please contact [facility] Please consult [email or web address] for contact info under      Signed, Leontine LOISE Salen, PA-C  04/01/2024 3:34 PM  I have personally seen and examined this patient. I agree with the assessment and plan as outlined above as outlined by Leontine Salen, PA-C.   74 yo male with history of tobacco abuse, HLD, mild CAD by CTA in 2020 admitted following syncopal event.  Pt was unaware of any palpitations or dizziness prior to waking up on the ground. He hit his head.  EKG with sinus, QTc 530 Labs reviewed: Troponin 40s to 60s and down to 40s.  My exam:  NAD RRR Clear bilat Ext: no LE edema  Plan:  Syncope: No clear  etiology. Will monitor on telemetry. Echo to assess LV function. He has not had anginal symptoms but with mild elevation in troponin could consider repeating his coronary CTA this week.   Lonni Cash, MD, [facility] 04/01/2024 5:03 PM       [1] No Known Allergies  "

## 2024-04-01 NOTE — ED Notes (Signed)
 Pt transported to CT ?

## 2024-04-01 NOTE — Hospital Course (Signed)
 Rodney Daniels is a 74 y.o. male with a past medical history of tobacco use who presents to the emergency department with syncopal episode and admitted for further evaluation and management.   #Syncope Patient had syncopal episode without prodrome.  Patient has not been seen by a medical doctor in quite some time.  With sudden syncope and collapse, patient needed inpatient admission.  Patient evaluated on telemetry during admission showing some occasional PVCs but no obvious arrhythmias.  Patient did have prolonged QTc which did improve.  Echo was unremarkable inpatient.  Cardiology consulted who recommended potential outpatient ischemic evaluation.  Patient worked with PT in the hospital and did well.  He will be discharged with event monitor with close follow-up with the internal medicine center for follow-up.  Patient also be followed up outpatient with cardiology.   #Fall Patient did have trauma scans during hospital ED course as he did have a syncope event which caused some forehead trauma.  Patient did not require any stitches or Dermabond.  Patient did have imaging which was negative for any fractures or intracranial hemorrhage.  Patient worked well with PT and is stable at discharge.   #Elevated troponin #Nonobstructive CAD Patient had coronary CT in 2020.  It showed nonobstructive disease.  Patient was started on aspirin  81 mg daily and was on atorvastatin  at that time in 2020.  Patient has not had these medications in quite some time.  Cardiology consulted during hospitalization.  They recommended restarting atorvastatin  and aspirin .     #Tobacco abuse During hospital stay, patient recommended for smoking cessation.   #Positive Cologuard Patient had positive Cologuard in 2020 on chart review.  Patient will need colonoscopy outpatient.   #Reactive airway disease Patient with past medical history of reactive airway disease.  This is likely in setting of longstanding smoking history.   Patient has not had formal PFTs.  Recommend full PFTs outpatient.  Did not require any steroids or DuoNebs during hospitalization.   #Hyperlipidemia Past medical history of hyperlipidemia.  On no medications.  Lipid panel in the hospital showed cholesterol 212, triglycerides 114, LDL 156.  Patient started back of atorvastatin  40 mg daily.

## 2024-04-01 NOTE — ED Provider Notes (Signed)
 Admitted to IM teaching service Dr Tobie Hughes, Donnice PARAS, MD 04/01/24 1311

## 2024-04-01 NOTE — ED Notes (Signed)
Got patient on the monitor did EKG shown to er provider

## 2024-04-01 NOTE — H&P (Cosign Needed Addendum)
 " Date: 04/01/2024               Patient Name:  Rodney Daniels MRN: [id number]  DOB: [birth year] Age / Sex: 74 y.o., male   PCP: Pcp, No         Medical Service: Internal Medicine Teaching Service         Attending Physician: Dr. Rosan Dayton BROCKS, DO      First Contact: Dr. Remonia Romano, DO    Second Contact: Dr. Damien Lease, DO         After Hours (After 5p/  First Contact Pager: [phone]  weekends / holidays): Second Contact Pager: [phone]   SUBJECTIVE   Chief Complaint: Syncope   History of Present Illness:   This is a 74 year old male with no documented tobacco use disorder who presents to the emergency department after having syncopal episode today.  He states he woke up this morning in his usual state of health with no concerns.  He states he made a cup of coffee, took a shower and went to work.  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  He went open all the doors like he usually does every morning.  Once he got to the kitchen door, he states he suddenly collapsed.  He had no prodromal symptoms.  He had no palpitations, chest pain, shortness of breath.  He states he woke up about 20 minutes later and he was able to call his supervisor after 3 to 4 minutes of coming back to who then ultimately called EMS.  He does not remember any of the other events surrounding the incident.  He does report hitting his head.  He has had no palpitations in the days preceding the event.  He denies any lightheadedness, dizziness, or leg swelling in the preceding days.  He denied any longer car rides or plane rides.  He denied any palpitations.  He denies any substance use.  He has not seen a primary care physician in quite some time.  He denies any bowel incontinence, urinary incontinence, or tongue bite.  He denies any history of seizures.  He has never had some like this happen to him before.  ED Course:  In the emergency department, initial vitals showing  temperature 97.4 F, pulse 91, blood pressure 151/91, satting at 97% on room air.  Labs were grossly unremarkable and imaging was also grossly remarkable.  With concern for high risk syncope, IMTS was consulted for admission.  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   OBJECTIVE:   Physical Exam: Blood pressure (!) 138/94, pulse 91, temperature (!) 97.4 F (36.3 C), temperature source Oral, resp. rate 17, SpO2 98%.  Constitutional: Resting  in bed, no acute distress HENT: Skin tear noted to left forehead, dressing in place Eyes: conjunctiva non-erythematous Cardiovascular: regular rate and rhythm, no m/r/g Pulmonary/Chest: normal work of breathing on room air, lungs clear to auscultation bilaterally MSK: normal bulk and tone Neurological: alert & oriented x 3, 5/5 strength in bilateral upper and lower extremities, normal gait Labs: CBC    Component Value Date/Time   WBC 15.4 (H) 04/01/2024 0859   RBC 5.00 04/01/2024 0859   HGB 15.0 04/01/2024 0859   HCT 43.8 04/01/2024 0859   PLT 247 04/01/2024 0859   MCV 87.6 04/01/2024 0859   MCH 30.0 04/01/2024 0859   MCHC 34.2 04/01/2024 0859   RDW 12.1 04/01/2024 0859   LYMPHSABS 2.3 10/25/2018 0907   MONOABS 0.5 10/25/2018 0907   EOSABS  0.4 10/25/2018 0907   BASOSABS 0.1 10/25/2018 0907     CMP     Component Value Date/Time   NA 137 04/01/2024 0859   K 3.4 (L) 04/01/2024 0859   CL 102 04/01/2024 0859   CO2 24 04/01/2024 0859   GLUCOSE 199 (H) 04/01/2024 0859   BUN 17 04/01/2024 0859   CREATININE 1.04 04/01/2024 0859   CALCIUM  8.8 (L) 04/01/2024 0859   PROT 6.3 12/17/2018 1443   ALBUMIN 4.2 12/17/2018 1443   AST 13 12/17/2018 1443   ALT 12 12/17/2018 1443   ALKPHOS 93 12/17/2018 1443   BILITOT 1.0 12/17/2018 1443   GFRNONAA >60 04/01/2024 0859    Imaging: CT angio Neck W/Wo Contrast  IMPRESSION: Normal  CT cervical Spine Wo contrast  IMPRESSION: 1. No acute traumatic injury identified in the cervical spine. 2. 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   CT Head Wo contrast   IMPRESSION: 1. Left forehead soft tissue injury. 2. Normal for age non-contrast CT appearance of the brain. 3. Paranasal sinus inflammation; trace hemorrhage in the left frontal sinus is felt less likely.   Chest X Ray  IMPRESSION: 1. No acute cardiopulmonary abnormality.  EKG: personally reviewed my interpretation is Normal Sinus rhythm with Prolonged Qtc.   ASSESSMENT & PLAN:   Assessment & Plan by Problem: Principal Problem:   Syncope and collapse Active Problems:   Dyslipidemia   Tobacco abuse   Elevated troponin   Positive colorectal cancer screening using Cologuard test   Reactive airway disease   CAD (coronary artery disease)   Rodney Daniels is a 74 y.o. male with a past medical history of tobacco use who presents to the emergency department with syncopal episode and admitted for further evaluation and management.  #Syncope Patient had syncopal episode without prodrome.  Patient has not been seen by a medical doctor in quite some time.  With sudden syncope and collapse, patient likely will need inpatient admission.  Will evaluate for arrhythmias  on telemetry.  Will also evaluate with echo to further characterize heart function.  Less likely seizure as patient did not have any tongue bite or incontinence and was able to come back to fairly quickly.  Do not think this is vasovagal in nature.  Patient was not tying a tie.  No stressors. - PT consult - Telemetry - Seizure precautions - Echo pending - Admit to observation - Follow-up TSH, magnesium, hemoglobin A1c  #Elevated troponin #Nonobstructive CAD Patient had coronary CT in 2020.  It showed nonobstructive disease.  Patient was started on aspirin  81 mg daily and was on atorvastatin  at that time in 2020.  Patient has not had  these medications in quite some time. Troponins increasing in emergency department.  No chest pain.  Less likely acute coronary syndrome at this time.  Will trend to peak. - Trend troponin - Monitor on telemetry - Follow-up lipid panel - Can hold aspirin  for now and hold atorvastatin  for now  #Tobacco abuse Longstanding history of tobacco use.  Counseled on cessation today.  Patient states she will think about it. - Smoking cessation counseling provided  #Positive Cologuard Patient had positive Cologuard in 2020 on chart review.  Patient will need colonoscopy outpatient. - Will need outpatient colonoscopy  #Reactive airway disease Patient with past medical history of reactive airway disease.  This is likely in setting of longstanding smoking history.  Patient has not had formal PFTs.  Recommend full PFTs outpatient.  No shortness of breath at this time.  Will give DuoNebs - Duonebs PRN  #Hyperlipidemia Past medical history of hyperlipidemia.  On no medications.  Last lipid panel on 12/17/2018 showed total cholesterol 169, triglycerides 79, LDL 108. - Repeat lipid panel  Diet: Normal VTE: Enoxaparin  IVF: None,None Code: Full  Prior to Admission Living Arrangement: Home, living at home with family Anticipated Discharge Location: Home Barriers to  Discharge: Clinical improvement, ECHO, and Telemetry read   Dispo: Admit patient to Observation with expected length of stay less than 2 midnights.  Signed: Tobie Gaines, DO Internal Medicine Resident PGY-3  Please page on call intern or resident: First contact: [phone] If no answer in 15 minutes, please contact senior pager at [phone]     [1]  No outpatient medications have been marked as taking for the 04/01/24 encounter [facility] Encounter).   "

## 2024-04-01 NOTE — ED Provider Notes (Signed)
 " [place] [facility] AT [facility] Provider Note   CSN: [id number] Arrival date & time: 04/01/24  9252     Patient presents with: No chief complaint on file.   Rodney Daniels is a 74 y.o. male with high cholesterol presenting to ED by EMS for concern for syncope and head injury.  Patient reportedly swiped into work today and then called a coworker a few minutes later reporting that he had fallen and needed help getting up.  EMS said the patient seemed dazed on their arrival and had a laceration on his head which they have bandaged.  Patient arrives in a C-spine collar.  The patient does not recall any prodrome prior to loss of consciousness.  There were no eyewitness accounts of what may have occurred at work.  The patient denies any medical issues, reports he takes no medicine.  He does not report history of seizure to me.  He reports he is from [place] and his daughter is likely coming into the hospital after him.  From his medical chart he does appear to have a history of smoking, pack-a-day, and also had a history of high cholesterol in the past   HPI     Prior to Admission medications  Medication Sig Start Date End Date Taking? Authorizing Provider  aspirin  EC 81 MG tablet Take 1 tablet (81 mg total) by mouth daily. Patient not taking: Reported on 04/01/2024 03/18/19   Lonni Slain, MD  atorvastatin  (LIPITOR) 20 MG tablet Take 1 tablet (20 mg total) by mouth daily. 03/18/19 06/16/19  Lonni Slain, MD    Allergies: Patient has no known allergies.    Review of Systems  Updated Vital Signs BP (!) 138/94   Pulse 91   Temp (!) 97.4 F (36.3 C) (Oral)   Resp 17   SpO2 98%   Physical Exam Constitutional:      General: He is not in acute distress. HENT:     Head: Normocephalic.     Comments: Superficial abrasion to left forehead 2x3 cm without deep laceration Eyes:     Conjunctiva/sclera: Conjunctivae normal.     Pupils: Pupils  are equal, round, and reactive to light.  Neck:     Comments: Cervical spine collar in place, some midline and paracervical tenderness on exam Cardiovascular:     Rate and Rhythm: Normal rate and regular rhythm.  Pulmonary:     Effort: Pulmonary effort is normal. No respiratory distress.  Abdominal:     General: There is no distension.     Tenderness: There is no abdominal tenderness.  Skin:    General: Skin is warm and dry.  Neurological:     General: No focal deficit present.     Mental Status: He is alert and oriented to person, place, and time. Mental status is at baseline.     (all labs ordered are listed, but only abnormal results are displayed) Labs Reviewed  BASIC METABOLIC PANEL WITH GFR - Abnormal; Notable for the following components:      Result Value   Potassium 3.4 (*)    Glucose, Bld 199 (*)    Calcium  8.8 (*)    All other components within normal limits  CBC - Abnormal; Notable for the following components:   WBC 15.4 (*)    All other components within normal limits  TROPONIN T, HIGH SENSITIVITY - Abnormal; Notable for the following components:   Troponin T High Sensitivity 42 (*)    All other  components within normal limits  TROPONIN T, HIGH SENSITIVITY - Abnormal; Notable for the following components:   Troponin T High Sensitivity 63 (*)    All other components within normal limits  ETHANOL    EKG: EKG Interpretation Date/Time:  Tuesday April 01 2024 08:39:47 EST Ventricular Rate:  88 PR Interval:  165 QRS Duration:  102 QT Interval:  421 QTC Calculation: 510 R Axis:   16  Text Interpretation: Sinus rhythm Prolonged QT interval Confirmed by Cottie Cough [phone]) on 04/01/2024 8:41:17 AM  Radiology: CT Angio Neck W and/or Wo Contrast Result Date: 04/01/2024 CLINICAL DATA:  Syncopal episode, confusion EXAM: CT ANGIOGRAPHY NECK TECHNIQUE: Multidetector CT imaging of the neck was performed using the standard protocol during bolus administration of  intravenous contrast. Multiplanar CT image reconstructions and MIPs were obtained to evaluate the vascular anatomy. Carotid stenosis measurements (when applicable) are obtained utilizing NASCET criteria, using the distal internal carotid diameter as the denominator. RADIATION DOSE REDUCTION: This exam was performed according to the departmental dose-optimization program which includes automated exposure control, adjustment of the mA and/or kV according to patient size and/or use of iterative reconstruction technique. CONTRAST:  75mL OMNIPAQUE  IOHEXOL  350 MG/ML SOLN COMPARISON:  None Available. CTA NECK: CTA NECK Aortic arch: No proximal vessel stenosis. Right carotid: Normal Left carotid: Normal Right vertebral: Normal Left vertebral: Normal Soft tissues: No significant abnormality Other comments: The intracranial vessels were included on this study. The intracranial vertebral arteries and basilar artery are normal. IMPRESSION: Normal Electronically Signed   By: Nancyann Burns M.D.   On: 04/01/2024 10:30   CT Cervical Spine Wo Contrast Result Date: 04/01/2024 EXAM: CT CERVICAL SPINE WITHOUT CONTRAST 04/01/2024 91:71:71 AM TECHNIQUE: CT of the cervical spine was performed without the administration of intravenous contrast. Multiplanar reformatted images are provided for review. Automated exposure control, iterative reconstruction, and/or weight based adjustment of the mA/kV was utilized to reduce the radiation dose to as low as reasonably achievable. COMPARISON: CT head 04/01/2024, CT chest 11/07/2018. CLINICAL HISTORY: 74 year old male with multiple traumatic injuries, blunt, status post unobserved fall, found down at work. FINDINGS: BONES AND ALIGNMENT: No acute fracture or traumatic malalignment. Mild straightening of cervical lordosis. DEGENERATIVE CHANGES: Degenerative ankylosis of the right sided C2-C3 facets. Widespread chronic disc and endplate degeneration including degenerative endplate spurring, sclerosis,  and small vacuum containing subchondral cysts. Asymmetric chronic right sided C1-C2 degeneration including degenerative right lateral C1 subchondral cyst. SOFT TISSUES: No prevertebral soft tissue swelling. Evidence of chronic cervical vertebral artery tortuosity. Mild postinflammatory right tonsillar pillar calcifications. Mild calcified atherosclerosis at the right carotid bifurcation. Otherwise negative visible non-contrast neck soft tissues. Mild apical lung scarring. Otherwise negative visible non-contrast thoracic inlet. IMPRESSION: 1. No acute traumatic injury identified in the cervical spine. 2. 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 Electronically signed by: Helayne Hurst MD 04/01/2024 08:38 AM EST RP Workstation: [id number]   CT Head Wo Contrast Result Date: 04/01/2024 EXAM: CT HEAD WITHOUT 04/01/2024 08:28:28 AM TECHNIQUE: CT of the head was performed without the administration of intravenous contrast. Automated exposure control, iterative reconstruction, and/or weight based adjustment of the mA/kV was utilized to reduce the radiation dose to as low as reasonably achievable. COMPARISON: CT head 08/10/2005. CLINICAL HISTORY: 74 year old male status post unobserved fall, found down at work. FINDINGS: BRAIN AND VENTRICLES: No acute intracranial hemorrhage. No mass effect or midline shift. No extra-axial fluid collection. No evidence of acute infarct. No hydrocephalus. Normal brain volume. Normal for age gray  white differentiation. Calcified atherosclerosis at the skull base. No suspicious intracranial vascular hyperdensity. ORBITS: No acute abnormality. SINUSES AND MASTOIDS: Mild to moderate bilateral paranasal sinus mucosal thickening with some bubbly opacity, generally appears inflammatory although hyperdense material in the left frontal sinus. No visible facial bone fracture. Tympanic cavities and mastoids appear clear. SOFT TISSUES AND SKULL: No  acute skull fracture. Left anterior superior forehead mild scalp soft tissue swelling with evidence of skin laceration. Small chronic dystrophic soft tissue calcification there was present in 2007. IMPRESSION: 1. Left forehead soft tissue injury. 2. Normal for age non-contrast CT appearance of the brain. 3. Paranasal sinus inflammation; trace hemorrhage in the left frontal sinus is felt less likely. Electronically signed by: Helayne Hurst MD 04/01/2024 08:34 AM EST RP Workstation: [id number]   DG Chest Portable 1 View Result Date: 04/01/2024 EXAM: 1 VIEW(S) XRAY OF THE CHEST 04/01/2024 08:20:27 AM COMPARISON: Chest CT 11/24/2019 and earlier. CLINICAL HISTORY: 74 year old male. Near syncope evaluation. FINDINGS: LUNGS AND PLEURA: Mildly lower lung volumes with mild crowding of markings. No pleural effusion. No pneumothorax. HEART AND MEDIASTINUM: No acute abnormality of the cardiac and mediastinal silhouettes. BONES AND SOFT TISSUES: No acute osseous abnormality. IMPRESSION: 1. No acute cardiopulmonary abnormality. Electronically signed by: Helayne Hurst MD 04/01/2024 08:25 AM EST RP Workstation: [id number]     Procedures   Medications Ordered in the ED  acetaminophen  (TYLENOL ) tablet 650 mg (has no administration in time range)  Tdap (ADACEL ) injection 0.5 mL (0.5 mLs Intramuscular Given 04/01/24 0854)  ketorolac  (TORADOL ) 30 MG/ML injection 30 mg (30 mg Intravenous Given 04/01/24 0906)  ondansetron  (ZOFRAN ) injection 4 mg (4 mg Intravenous Given 04/01/24 0906)  iohexol  (OMNIPAQUE ) 350 MG/ML injection 75 mL (75 mLs Intravenous Contrast Given 04/01/24 1025)    Clinical Course as of 04/01/24 1305  Tue Apr 01, 2024  0854 Patient's daughter at bedside [MT]  1229 Trop's elevated, no chest pain - will plan to admit [MT]  1231 Back pain is improved with the Toradol  but still present.  Patient reports he has degenerative disks in his neck.  This may more likely be related to neuropathic pain at this point.   Fortunately, he does not have weakness or numbness of the arms or legs to raise concern for spinal cord impingement [MT]    Clinical Course User Index [MT] Kern Gingras, Donnice PARAS, MD                                 Medical Decision Making Amount and/or Complexity of Data Reviewed Labs: ordered. Radiology: ordered. ECG/medicine tests: ordered.  Risk OTC drugs. Prescription drug management. Decision regarding hospitalization.   This patient presents to the ED with concern for possible syncope or near syncope with suspected traumatic head injury. This involves an extensive number of treatment options, and is a complaint that carries with it a high risk of complications and morbidity.  The differential diagnosis includes arrhythmia versus metabolic derangement versus drug toxidrome versus pneumothorax versus atypical ACS versus other  Co-morbidities that complicate the patient evaluation: Advanced age and high cholesterol and smoking his risk factors for cardiovascular disease  Forehead contusion is an abrasion type injury that is not amenable to laceration repair or suturing.  It can be cleaned and covered with sterile nonadhesive bandage  Additional history obtained from EMS  External records from outside source obtained and reviewed including cardiology evaluation 2020 with a coronary calcium  score of 21, minimal  coronary disease, 20th percentile for age; stress test also reportedly unremarkable at that time without evidence of ischemia  I ordered and personally interpreted labs.  The pertinent results include: Minorly elevated white blood cell count which may be reactive to syncope.  Troponins do have also some minor elevation noted.  There is some hyperglycemia.  I ordered imaging studies including CT imaging of the head and cervical spine, x-ray of the chest; ct angiogram of the neck I independently visualized and interpreted imaging which showed no emergent finding I agree with the  radiologist interpretation  The patient was maintained on a cardiac monitor.  I personally viewed and interpreted the cardiac monitored which showed an underlying rhythm of: Sinus rhythm  Per my interpretation the patient's ECG shows no acute ischemic findings or high-grade arrhythmia  I ordered medication including Tylenol , Toradol , Zofran , for headache, neck pain, nausea  I have reviewed the patients home medicines and have made adjustments as needed  Test Considered: Patient has equal pulses and no chest pain or discomfort to raise immediate concern at this time for aortic dissection.  He does not have significant hypertension, tachycardia, or findings of dissection on CT angiogram of the neck, nor widened mediastinum on his chest x-ray.  Patient also does not have risk factors or concern at this time for large pulmonary embolism as a cause of his syncope.  He does not have tachycardia or hypoxia.  I do not believe we are needing emergent angiogram imaging of the chest.  There are some minor elevations of his troponins, but given that he does not have chest pain, does not have ischemia in the EKG, I do not think he is requiring emergent cardiology consultation or catheterization for ACS.   After the interventions noted above, I reevaluated the patient and found that they have: improved  Social Determinants of Health: smoking cessation counseled  Dispostion:  After consideration of the diagnostic results and the patients response to treatment, I feel that the patent would benefit from medical admission.      Final diagnoses:  Syncope, unspecified syncope type  Injury of forehead, initial encounter  Hyperglycemia  Elevated troponin    ED Discharge Orders     None          Cottie Donnice PARAS, MD 04/01/24 1305  "

## 2024-04-01 NOTE — ED Notes (Signed)
Walked patient to the bathroom patient did well patient is now back in bed on the monitor with call bell in reach

## 2024-04-02 ENCOUNTER — Other Ambulatory Visit: Payer: Self-pay

## 2024-04-02 ENCOUNTER — Other Ambulatory Visit: Payer: Self-pay | Admitting: Cardiology

## 2024-04-02 ENCOUNTER — Encounter: Payer: Self-pay | Admitting: *Deleted

## 2024-04-02 ENCOUNTER — Other Ambulatory Visit (HOSPITAL_COMMUNITY): Payer: Self-pay

## 2024-04-02 ENCOUNTER — Ambulatory Visit

## 2024-04-02 ENCOUNTER — Ambulatory Visit (HOSPITAL_COMMUNITY)

## 2024-04-02 DIAGNOSIS — F17200 Nicotine dependence, unspecified, uncomplicated: Secondary | ICD-10-CM

## 2024-04-02 DIAGNOSIS — J45909 Unspecified asthma, uncomplicated: Secondary | ICD-10-CM | POA: Diagnosis not present

## 2024-04-02 DIAGNOSIS — R55 Syncope and collapse: Secondary | ICD-10-CM

## 2024-04-02 DIAGNOSIS — E785 Hyperlipidemia, unspecified: Secondary | ICD-10-CM | POA: Diagnosis not present

## 2024-04-02 DIAGNOSIS — R7989 Other specified abnormal findings of blood chemistry: Secondary | ICD-10-CM | POA: Diagnosis not present

## 2024-04-02 DIAGNOSIS — R195 Other fecal abnormalities: Secondary | ICD-10-CM | POA: Diagnosis not present

## 2024-04-02 DIAGNOSIS — I2510 Atherosclerotic heart disease of native coronary artery without angina pectoris: Secondary | ICD-10-CM

## 2024-04-02 LAB — LIPID PANEL
Cholesterol: 212 mg/dL — ABNORMAL HIGH (ref 0–200)
HDL: 33 mg/dL — ABNORMAL LOW
LDL Cholesterol: 156 mg/dL — ABNORMAL HIGH (ref 0–99)
Total CHOL/HDL Ratio: 6.4 ratio
Triglycerides: 114 mg/dL
VLDL: 23 mg/dL (ref 0–40)

## 2024-04-02 LAB — HEMOGLOBIN A1C
Hgb A1c MFr Bld: 5.6 % (ref 4.8–5.6)
Mean Plasma Glucose: 114.02 mg/dL

## 2024-04-02 LAB — COMPREHENSIVE METABOLIC PANEL WITH GFR
ALT: 12 U/L (ref 0–44)
AST: 17 U/L (ref 15–41)
Albumin: 3.6 g/dL (ref 3.5–5.0)
Alkaline Phosphatase: 77 U/L (ref 38–126)
Anion gap: 12 (ref 5–15)
BUN: 17 mg/dL (ref 8–23)
CO2: 21 mmol/L — ABNORMAL LOW (ref 22–32)
Calcium: 8.7 mg/dL — ABNORMAL LOW (ref 8.9–10.3)
Chloride: 105 mmol/L (ref 98–111)
Creatinine, Ser: 0.92 mg/dL (ref 0.61–1.24)
GFR, Estimated: >60 mL/min
Glucose, Bld: 99 mg/dL (ref 70–99)
Potassium: 4.2 mmol/L (ref 3.5–5.1)
Sodium: 137 mmol/L (ref 135–145)
Total Bilirubin: 0.7 mg/dL (ref 0.0–1.2)
Total Protein: 5.9 g/dL — ABNORMAL LOW (ref 6.5–8.1)

## 2024-04-02 LAB — ECHOCARDIOGRAM COMPLETE
Area-P 1/2: 3.65 cm2
Height: 73 in

## 2024-04-02 LAB — CBC
HCT: 40.2 % (ref 39.0–52.0)
Hemoglobin: 13.7 g/dL (ref 13.0–17.0)
MCH: 30.3 pg (ref 26.0–34.0)
MCHC: 34.1 g/dL (ref 30.0–36.0)
MCV: 88.9 fL (ref 80.0–100.0)
Platelets: 205 K/uL (ref 150–400)
RBC: 4.52 MIL/uL (ref 4.22–5.81)
RDW: 12.5 % (ref 11.5–15.5)
WBC: 10.7 K/uL — ABNORMAL HIGH (ref 4.0–10.5)
nRBC: 0 % (ref 0.0–0.2)

## 2024-04-02 MED ORDER — AMLODIPINE BESYLATE 5 MG PO TABS
5.0000 mg | ORAL_TABLET | Freq: Every day | ORAL | 0 refills | Status: AC
  Filled 2024-04-02: qty 30, 30d supply, fill #0

## 2024-04-02 MED ORDER — ATORVASTATIN CALCIUM 40 MG PO TABS
40.0000 mg | ORAL_TABLET | Freq: Every day | ORAL | 0 refills | Status: AC
  Filled 2024-04-02: qty 30, 30d supply, fill #0

## 2024-04-02 NOTE — Discharge Instructions (Addendum)
 You were hospitalized for an episode of passing out. So far your labs have come back normal and the imaging of your heart as well.The cardiologists would like to monitor your heart when you leave the hospital.  Thank you for allowing us  to be part of your care.   Please arrange hospital follow-up with:  Please go to your appointment with Dr. Charmayne on 1/21 at 10:45 am 04/29/2024 at 10:55 am with the Cardiology group   Please note these changes made to your medications:  *Please START taking:  Amlodipine  5 mg daily Aspirin  81 mg daily Atorvastatin  40 mg daily( this is a change from your previous dose)  Please call our clinic if you have any questions or concerns, we may be able to help and keep you from a long and expensive emergency room wait. Our clinic and after hours phone number is [phone], the best time to call is Monday through Friday 9 am to 4 pm but there is always someone available 24/7 if you have an emergency. If you need medication refills please notify your pharmacy one week in advance and they will send us  a request.

## 2024-04-02 NOTE — ED Notes (Signed)
 April Furniss-Roe, the patient's daughter [phone] is requesting an update on his plan of care and admission status.

## 2024-04-02 NOTE — Progress Notes (Signed)
 "  Rounding Note   Patient Name: Rodney Daniels Date of Encounter: 04/02/2024  [facility] Cardiologist: Shelda Bruckner, MD   Subjective  No issues overnight. No chest pain or dyspnea.   Scheduled Meds:  amLODipine   5 mg Oral Daily   aspirin  EC  81 mg Oral Daily   atorvastatin   40 mg Oral Daily   enoxaparin  (LOVENOX ) injection  40 mg Subcutaneous Q24H   Continuous Infusions:  PRN Meds: acetaminophen  **OR** acetaminophen , polyethylene glycol   Vital Signs  Vitals:   04/02/24 0400 04/02/24 0500 04/02/24 0600 04/02/24 0800  BP: 108/83 106/76 107/72 125/81  Pulse: 63 (!) 59 64 63  Resp: 12 14 13 16   Temp: 98.5 F (36.9 C)   98.1 F (36.7 C)  TempSrc: Oral   Oral  SpO2: 99% 95% 95% 96%   No intake or output data in the 24 hours ending 04/02/24 0838    03/18/2019    4:19 PM 02/17/2019   10:18 AM 12/17/2018    9:32 AM  Last 3 Weights  Weight (lbs) 180 lb 175 lb 178 lb  Weight (kg) 81.647 kg 79.379 kg 80.74 kg      Telemetry Sinus with rare PVCs - Personally Reviewed  ECG  No am tracing   Physical Exam  GEN: No acute distress.   Neck: No JVD Cardiac: RRR, no murmurs, rubs, or gallops.  Respiratory: Clear to auscultation bilaterally. GI: Soft, nontender, non-distended  MS: No edema; No deformity. Neuro:  Nonfocal  Psych: Normal affect   Labs High Sensitivity Troponin:  No results for input(s): TROPONINIHS in the last 720 hours.  Recent Labs  Lab 04/01/24 0859 04/01/24 0952 04/01/24 1500 04/01/24 1610  TRNPT 42* 63* 49* 39*       Chemistry Recent Labs  Lab 04/01/24 0859 04/01/24 1500 04/02/24 0400  NA 137  --  137  K 3.4*  --  4.2  CL 102  --  105  CO2 24  --  21*  GLUCOSE 199*  --  99  BUN 17  --  17  CREATININE 1.04  --  0.92  CALCIUM  8.8*  --  8.7*  MG  --  2.0  --   PROT  --   --  5.9*  ALBUMIN  --   --  3.6  AST  --   --  17  ALT  --   --  12  ALKPHOS  --   --  77  BILITOT  --   --  0.7  GFRNONAA >60  --   >60  ANIONGAP 11  --  12    Lipids  Recent Labs  Lab 04/02/24 0400  CHOL 212*  TRIG 114  HDL 33*  LDLCALC 156*  CHOLHDL 6.4    Hematology Recent Labs  Lab 04/01/24 0859 04/02/24 0400  WBC 15.4* 10.7*  RBC 5.00 4.52  HGB 15.0 13.7  HCT 43.8 40.2  MCV 87.6 88.9  MCH 30.0 30.3  MCHC 34.2 34.1  RDW 12.1 12.5  PLT 247 205   Thyroid  Recent Labs  Lab 04/01/24 1500  TSH 1.020    BNPNo results for input(s): BNP, PROBNP in the last 168 hours.  DDimer No results for input(s): DDIMER in the last 168 hours.   Radiology  CT Angio Neck W and/or Wo Contrast Result Date: 04/01/2024 CLINICAL DATA:  Syncopal episode, confusion EXAM: CT ANGIOGRAPHY NECK TECHNIQUE: Multidetector CT imaging of the neck was performed using the standard protocol  during bolus administration of intravenous contrast. Multiplanar CT image reconstructions and MIPs were obtained to evaluate the vascular anatomy. Carotid stenosis measurements (when applicable) are obtained utilizing NASCET criteria, using the distal internal carotid diameter as the denominator. RADIATION DOSE REDUCTION: This exam was performed according to the departmental dose-optimization program which includes automated exposure control, adjustment of the mA and/or kV according to patient size and/or use of iterative reconstruction technique. CONTRAST:  75mL OMNIPAQUE  IOHEXOL  350 MG/ML SOLN COMPARISON:  None Available. CTA NECK: CTA NECK Aortic arch: No proximal vessel stenosis. Right carotid: Normal Left carotid: Normal Right vertebral: Normal Left vertebral: Normal Soft tissues: No significant abnormality Other comments: The intracranial vessels were included on this study. The intracranial vertebral arteries and basilar artery are normal. IMPRESSION: Normal Electronically Signed   By: Nancyann Burns M.D.   On: 04/01/2024 10:30   CT Cervical Spine Wo Contrast Result Date: 04/01/2024 EXAM: CT CERVICAL SPINE WITHOUT CONTRAST 04/01/2024 91:71:71 AM  TECHNIQUE: CT of the cervical spine was performed without the administration of intravenous contrast. Multiplanar reformatted images are provided for review. Automated exposure control, iterative reconstruction, and/or weight based adjustment of the mA/kV was utilized to reduce the radiation dose to as low as reasonably achievable. COMPARISON: CT head 04/01/2024, CT chest 11/07/2018. CLINICAL HISTORY: 73 year old male with multiple traumatic injuries, blunt, status post unobserved fall, found down at work. FINDINGS: BONES AND ALIGNMENT: No acute fracture or traumatic malalignment. Mild straightening of cervical lordosis. DEGENERATIVE CHANGES: Degenerative ankylosis of the right sided C2-C3 facets. Widespread chronic disc and endplate degeneration including degenerative endplate spurring, sclerosis, and small vacuum containing subchondral cysts. Asymmetric chronic right sided C1-C2 degeneration including degenerative right lateral C1 subchondral cyst. SOFT TISSUES: No prevertebral soft tissue swelling. Evidence of chronic cervical vertebral artery tortuosity. Mild postinflammatory right tonsillar pillar calcifications. Mild calcified atherosclerosis at the right carotid bifurcation. Otherwise negative visible non-contrast neck soft tissues. Mild apical lung scarring. Otherwise negative visible non-contrast thoracic inlet. IMPRESSION: 1. No acute traumatic injury identified in the cervical spine. 2. 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 Electronically signed by: Helayne Hurst MD 04/01/2024 08:38 AM EST RP Workstation: [id number]   CT Head Wo Contrast Result Date: 04/01/2024 EXAM: CT HEAD WITHOUT 04/01/2024 08:28:28 AM TECHNIQUE: CT of the head was performed without the administration of intravenous contrast. Automated exposure control, iterative reconstruction, and/or weight based adjustment of the mA/kV was utilized to reduce the radiation dose to as low as  reasonably achievable. COMPARISON: CT head 08/10/2005. CLINICAL HISTORY: 74 year old male status post unobserved fall, found down at work. FINDINGS: BRAIN AND VENTRICLES: No acute intracranial hemorrhage. No mass effect or midline shift. No extra-axial fluid collection. No evidence of acute infarct. No hydrocephalus. Normal brain volume. Normal for age gray white differentiation. Calcified atherosclerosis at the skull base. No suspicious intracranial vascular hyperdensity. ORBITS: No acute abnormality. SINUSES AND MASTOIDS: Mild to moderate bilateral paranasal sinus mucosal thickening with some bubbly opacity, generally appears inflammatory although hyperdense material in the left frontal sinus. No visible facial bone fracture. Tympanic cavities and mastoids appear clear. SOFT TISSUES AND SKULL: No acute skull fracture. Left anterior superior forehead mild scalp soft tissue swelling with evidence of skin laceration. Small chronic dystrophic soft tissue calcification there was present in 2007. IMPRESSION: 1. Left forehead soft tissue injury. 2. Normal for age non-contrast CT appearance of the brain. 3. Paranasal sinus inflammation; trace hemorrhage in the left frontal sinus is felt less likely. Electronically signed by: Helayne Hurst MD  04/01/2024 08:34 AM EST RP Workstation: [id number]   DG Chest Portable 1 View Result Date: 04/01/2024 EXAM: 1 VIEW(S) XRAY OF THE CHEST 04/01/2024 08:20:27 AM COMPARISON: Chest CT 11/24/2019 and earlier. CLINICAL HISTORY: 74 year old male. Near syncope evaluation. FINDINGS: LUNGS AND PLEURA: Mildly lower lung volumes with mild crowding of markings. No pleural effusion. No pneumothorax. HEART AND MEDIASTINUM: No acute abnormality of the cardiac and mediastinal silhouettes. BONES AND SOFT TISSUES: No acute osseous abnormality. IMPRESSION: 1. No acute cardiopulmonary abnormality. Electronically signed by: Helayne Hurst MD 04/01/2024 08:25 AM EST RP Workstation: [id number]    Patient  Profile    74 y.o. male with history of tobacco abuse, HLD and mild CAD by coronary CTA in 2020 who is admitted following a syncopal event.   Assessment & Plan   Syncope: He had no warning before his syncopal event. EKG without ischemic changes. No concerning symptoms suggesting ischemia/angina. Troponin 42-->63-->39.  QTc was 530.  Telemetry with no arrhythmias, pauses or block.  -Repeat EKG today to look at QT -Check echo today.  -Continue telemetry today If echo ok, may be able to discharge home today with an event monitor We can consider outpatient ischemic testing as an outpatient   For questions or updates, please contact [facility] Please consult [email or web address] for contact info under    Lonni Cash, MD  04/02/2024, 8:38 AM    "

## 2024-04-02 NOTE — ED Notes (Signed)
 Patient ambulated to toilet without complications or complaints. New linens placed on bed and patient provided with new, warm blankets. Bed locked and in lowest position with call bell within reach. Patient denies any needs/complaints at this time.

## 2024-04-02 NOTE — Progress Notes (Unsigned)
Enrolled for [employer] to mail a ZIO XT long term holter monitor to the patients address on file.   Letter with instructions mailed to patient.  Dr. Jodelle Red to read.

## 2024-04-02 NOTE — Progress Notes (Signed)
 14d monitor ordered for syncope. Dr. Lonni to read

## 2024-04-02 NOTE — ED Notes (Signed)
 The patient's daughter,April, was provided an update via telephone.

## 2024-04-02 NOTE — ED Notes (Signed)
" °   04/02/24 1011 04/02/24 1012 04/02/24 1013  Vitals  BP 121/82 116/89 123/84  MAP (mmHg) 92 98 96  Patient Position (if appropriate) Lying Sitting Standing  Pulse Rate (!) 59 71 70  ECG Heart Rate 60 75 73  Resp 12 14 14   MEWS COLOR  MEWS Score Color Landy Landy Green  Oxygen Therapy  SpO2 95 % 94 % 92 %   ORTHOSTATICS NEGATIVE "

## 2024-04-02 NOTE — Progress Notes (Signed)
 Echocardiogram 2D Echocardiogram has been performed.  Rodney Daniels 04/02/2024, 2:20 PM

## 2024-04-02 NOTE — Discharge Summary (Signed)
 "   Name: Rodney Daniels MRN: [id number] DOB: [birth year] 74 y.o. PCP: Pcp, No  Date of Admission: 04/01/2024  7:47 AM Date of Discharge: 04/02/2024  3:41 PM Attending Physician: Dr. Dayton Daniels  Discharge Diagnosis: 1. Principal Problem:   Syncope and collapse Active Problems:   Dyslipidemia   Tobacco abuse   Elevated troponin   Positive colorectal cancer screening using Cologuard test   Reactive airway disease   CAD (coronary artery disease)   Discharge Medications: Allergies as of 04/02/2024   No Known Allergies      Medication List     TAKE these medications    amLODipine  5 MG tablet Commonly known as: NORVASC  Take 1 tablet (5 mg total) by mouth daily. Start taking on: April 03, 2024   aspirin  EC 81 MG tablet Take 1 tablet (81 mg total) by mouth daily.   atorvastatin  40 MG tablet Commonly known as: LIPITOR Take 1 tablet (40 mg total) by mouth daily. Start taking on: April 03, 2024 What changed:  medication strength how much to take        Disposition and follow-up:   Rodney Daniels was discharged from [facility] in Good condition.  At the hospital follow up visit please address:  1.  A) Syncope: High risk syncope.  Patient discharged on cardiac monitor.  Ensure patient follow-up cardiology outpatient.  Ask about further syncopal episodes outpatient.  Monitor electrolytes.      B) Hyperlipidemia: Patient discharged on atorvastatin  40 mg daily.  Continue monitor lipids outpatient.      C) Nonobstructive CAD: Patient discharged on aspirin .  Ensure patient to follow-up with cardiology outpatient.  May need ischemic evaluation outpatient.  Echo on patient was normal.      D) Positive Cologuard: Patient had positive Cologuard in 2020.  Patient will need to be referred to colonoscopy outpatient.      E) Reactive airway disease: Patient has a history of smoking.  Likely has some form of reactive airway disease.  Referred PFTs  outpatient  2.  Labs / imaging needed at time of follow-up: CBC, BMP, magnesium  3.  Pending labs/ test needing follow-up: N/A  Follow-up Appointments:  Follow-up Information     Rodney Holmes, DO Follow up.   Specialty: Internal Medicine Contact information: [address] [place] [address] [phone]         Rodney Reche RAMAN, NP Follow up.   Specialty: Cardiology Contact information: [address] [place] [address] [phone]                  Hospital Course by problem list: Rodney Daniels is a 74 y.o. male with a past medical history of tobacco use who presents to the emergency department with syncopal episode and admitted for further evaluation and management.   #Syncope Patient had syncopal episode without prodrome.  Patient has not been seen by a medical doctor in quite some time.  With sudden syncope and collapse, patient needed inpatient admission.  Patient evaluated on telemetry during admission showing some occasional PVCs but no obvious arrhythmias.  Patient did have prolonged QTc which did improve.  Echo was unremarkable inpatient.  Cardiology consulted who recommended potential outpatient ischemic evaluation.  Patient worked with PT in the hospital and did well.  He will be discharged with event monitor with close follow-up with the internal medicine center for follow-up.  Patient also be followed up outpatient with cardiology.   #Fall Patient  did have trauma scans during hospital ED course as he did have a syncope event which caused some forehead trauma.  Patient did not require any stitches or Dermabond.  Patient did have imaging which was negative for any fractures or intracranial hemorrhage.  Patient worked well with PT and is stable at discharge.   #Elevated troponin #Nonobstructive CAD Patient had coronary CT in 2020.  It showed nonobstructive disease.  Patient was started on aspirin  81 mg daily and was on  atorvastatin  at that time in 2020.  Patient has not had these medications in quite some time.  Cardiology consulted during hospitalization.  They recommended restarting atorvastatin  and aspirin .     #Tobacco abuse During hospital stay, patient recommended for smoking cessation.   #Positive Cologuard Patient had positive Cologuard in 2020 on chart review.  Patient will need colonoscopy outpatient.   #Reactive airway disease Patient with past medical history of reactive airway disease.  This is likely in setting of longstanding smoking history.  Patient has not had formal PFTs.  Recommend full PFTs outpatient.  Did not require any steroids or DuoNebs during hospitalization.   #Hyperlipidemia Past medical history of hyperlipidemia.  On no medications.  Lipid panel in the hospital showed cholesterol 212, triglycerides 114, LDL 156.  Patient started back of atorvastatin  40 mg daily.   Discharge subjective: Patient states that he was able to ambulate in the hallway and no episodes of syncope.   Discharge Exam:   BP 122/84   Pulse 80   Temp 98.2 F (36.8 C) (Oral)   Resp 18   Ht 6' 1 (1.854 m)   SpO2 99%   BMI 23.75 kg/m  Discharge exam:  Physical Exam Constitutional:      General: He is not in acute distress.    Appearance: He is normal weight.  Cardiovascular:     Rate and Rhythm: Normal rate and regular rhythm.  Pulmonary:     Effort: Pulmonary effort is normal. No respiratory distress.     Breath sounds: No wheezing.     Comments: Crackles bibasilar  Musculoskeletal:     Right lower leg: No edema.     Left lower leg: No edema.  Neurological:     Mental Status: He is alert.      Pertinent Labs, Studies, and Procedures:     Latest Ref Rng & Units 04/02/2024    4:00 AM 04/01/2024    8:59 AM 10/25/2018    9:07 AM  CBC  WBC 4.0 - 10.5 K/uL 10.7  15.4  7.3   Hemoglobin 13.0 - 17.0 g/dL 86.2  84.9  83.9   Hematocrit 39.0 - 52.0 % 40.2  43.8  47.4   Platelets 150 - 400 K/uL  205  247  204.0        Latest Ref Rng & Units 04/02/2024    4:00 AM 04/01/2024    8:59 AM 02/24/2019    3:35 PM  CMP  Glucose 70 - 99 mg/dL 99  800    BUN 8 - 23 mg/dL 17  17    Creatinine 9.38 - 1.24 mg/dL 9.07  8.95  8.99   Sodium 135 - 145 mmol/L 137  137    Potassium 3.5 - 5.1 mmol/L 4.2  3.4    Chloride 98 - 111 mmol/L 105  102    CO2 22 - 32 mmol/L 21  24    Calcium  8.9 - 10.3 mg/dL 8.7  8.8    Total Protein 6.5 -  8.1 g/dL 5.9     Total Bilirubin 0.0 - 1.2 mg/dL 0.7     Alkaline Phos 38 - 126 U/L 77     AST 15 - 41 U/L 17     ALT 0 - 44 U/L 12       ECHOCARDIOGRAM COMPLETE Result Date: 04/02/2024    ECHOCARDIOGRAM REPORT   Patient Name:   Kweli RAFEL GARDE Date of Exam: 04/02/2024 Medical Rec #:  [id number]           Height:       73.0 in Accession #:    [id number]          Weight:       180.0 lb Date of Birth:  08/23/1950           BSA:          2.057 m Patient Age:    73 years            BP:           125/83 mmHg Patient Gender: M                   HR:           65 bpm. Exam Location:  Inpatient Procedure: 2D Echo, Cardiac Doppler and Color Doppler (Both Spectral and Color            Flow Doppler were utilized during procedure). Indications:    Syncope  History:        Patient has no prior history of Echocardiogram examinations.                 CAD, Signs/Symptoms:Syncope; Risk Factors:Current Smoker and                 Dyslipidemia.  Sonographer:    Juliene Rucks Referring Phys: [id number] ERIK C HOFFMAN  Sonographer Comments: Suboptimal parasternal window. IMPRESSIONS  1. Left ventricular ejection fraction, by estimation, is 60 to 65%. The left ventricle has normal function. The left ventricle has no regional wall motion abnormalities. Left ventricular diastolic parameters were normal.  2. Right ventricular systolic function is normal. The right ventricular size is normal. Tricuspid regurgitation signal is inadequate for assessing PA pressure.  3. The mitral valve is normal in structure.  Trivial mitral valve regurgitation. No evidence of mitral stenosis.  4. The aortic valve is normal in structure. Aortic valve regurgitation is not visualized. No aortic stenosis is present.  5. The inferior vena cava is normal in size with greater than 50% respiratory variability, suggesting right atrial pressure of 3 mmHg. FINDINGS  Left Ventricle: Left ventricular ejection fraction, by estimation, is 60 to 65%. The left ventricle has normal function. The left ventricle has no regional wall motion abnormalities. The left ventricular internal cavity size was normal in size. There is  no left ventricular hypertrophy. Left ventricular diastolic parameters were normal. Right Ventricle: The right ventricular size is normal. No increase in right ventricular wall thickness. Right ventricular systolic function is normal. Tricuspid regurgitation signal is inadequate for assessing PA pressure. Left Atrium: Left atrial size was normal in size. Right Atrium: Right atrial size was normal in size. Pericardium: There is no evidence of pericardial effusion. Mitral Valve: The mitral valve is normal in structure. Trivial mitral valve regurgitation. No evidence of mitral valve stenosis. Tricuspid Valve: The tricuspid valve is normal in structure. Tricuspid valve regurgitation is not demonstrated. No evidence of tricuspid stenosis. Aortic Valve: The aortic valve is normal  in structure. Aortic valve regurgitation is not visualized. No aortic stenosis is present. Pulmonic Valve: The pulmonic valve was normal in structure. Pulmonic valve regurgitation is trivial. No evidence of pulmonic stenosis. Aorta: The aortic root is normal in size and structure. Venous: The inferior vena cava is normal in size with greater than 50% respiratory variability, suggesting right atrial pressure of 3 mmHg. IAS/Shunts: No atrial level shunt detected by color flow Doppler.  LEFT VENTRICLE PLAX 2D LVOT diam:     2.20 cm   Diastology LV SV:         77         LV e' medial:    8.81 cm/s LV SV Index:   37        LV E/e' medial:  10.2 LVOT Area:     3.80 cm  LV e' lateral:   12.20 cm/s                          LV E/e' lateral: 7.3  RIGHT VENTRICLE             IVC RV Basal diam:  2.80 cm     IVC diam: 1.50 cm RV Mid diam:    2.00 cm RV S prime:     12.20 cm/s TAPSE (M-mode): 2.2 cm LEFT ATRIUM             Index        RIGHT ATRIUM          Index LA Vol (A2C):   36.3 ml 17.64 ml/m  RA Area:     8.36 cm LA Vol (A4C):   41.2 ml 20.03 ml/m  RA Volume:   15.30 ml 7.44 ml/m LA Biplane Vol: 39.4 ml 19.15 ml/m  AORTIC VALVE LVOT Vmax:   107.00 cm/s LVOT Vmean:  68.100 cm/s LVOT VTI:    0.202 m  AORTA Ao Root diam: 3.10 cm MITRAL VALVE MV Area (PHT): 3.65 cm    SHUNTS MV Decel Time: 208 msec    Systemic VTI:  0.20 m MV E velocity: 89.50 cm/s  Systemic Diam: 2.20 cm MV A velocity: 84.00 cm/s MV E/A ratio:  1.07 Toribio Fuel MD Electronically signed by Toribio Fuel MD Signature Date/Time: 04/02/2024/2:48:00 PM    Final    CT Angio Neck W and/or Wo Contrast Result Date: 04/01/2024 CLINICAL DATA:  Syncopal episode, confusion EXAM: CT ANGIOGRAPHY NECK TECHNIQUE: Multidetector CT imaging of the neck was performed using the standard protocol during bolus administration of intravenous contrast. Multiplanar CT image reconstructions and MIPs were obtained to evaluate the vascular anatomy. Carotid stenosis measurements (when applicable) are obtained utilizing NASCET criteria, using the distal internal carotid diameter as the denominator. RADIATION DOSE REDUCTION: This exam was performed according to the departmental dose-optimization program which includes automated exposure control, adjustment of the mA and/or kV according to patient size and/or use of iterative reconstruction technique. CONTRAST:  75mL OMNIPAQUE  IOHEXOL  350 MG/ML SOLN COMPARISON:  None Available. CTA NECK: CTA NECK Aortic arch: No proximal vessel stenosis. Right carotid: Normal Left carotid: Normal Right  vertebral: Normal Left vertebral: Normal Soft tissues: No significant abnormality Other comments: The intracranial vessels were included on this study. The intracranial vertebral arteries and basilar artery are normal. IMPRESSION: Normal Electronically Signed   By: Nancyann Burns M.D.   On: 04/01/2024 10:30   CT Cervical Spine Wo Contrast Result Date: 04/01/2024 EXAM: CT CERVICAL SPINE WITHOUT CONTRAST 04/01/2024 08:28:28 AM TECHNIQUE:  CT of the cervical spine was performed without the administration of intravenous contrast. Multiplanar reformatted images are provided for review. Automated exposure control, iterative reconstruction, and/or weight based adjustment of the mA/kV was utilized to reduce the radiation dose to as low as reasonably achievable. COMPARISON: CT head 04/01/2024, CT chest 11/07/2018. CLINICAL HISTORY: 74 year old male with multiple traumatic injuries, blunt, status post unobserved fall, found down at work. FINDINGS: BONES AND ALIGNMENT: No acute fracture or traumatic malalignment. Mild straightening of cervical lordosis. DEGENERATIVE CHANGES: Degenerative ankylosis of the right sided C2-C3 facets. Widespread chronic disc and endplate degeneration including degenerative endplate spurring, sclerosis, and small vacuum containing subchondral cysts. Asymmetric chronic right sided C1-C2 degeneration including degenerative right lateral C1 subchondral cyst. SOFT TISSUES: No prevertebral soft tissue swelling. Evidence of chronic cervical vertebral artery tortuosity. Mild postinflammatory right tonsillar pillar calcifications. Mild calcified atherosclerosis at the right carotid bifurcation. Otherwise negative visible non-contrast neck soft tissues. Mild apical lung scarring. Otherwise negative visible non-contrast thoracic inlet. IMPRESSION: 1. No acute traumatic injury identified in the cervical spine. 2. 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  Electronically signed by: Helayne Hurst MD 04/01/2024 08:38 AM EST RP Workstation: [id number]   CT Head Wo Contrast Result Date: 04/01/2024 EXAM: CT HEAD WITHOUT 04/01/2024 08:28:28 AM TECHNIQUE: CT of the head was performed without the administration of intravenous contrast. Automated exposure control, iterative reconstruction, and/or weight based adjustment of the mA/kV was utilized to reduce the radiation dose to as low as reasonably achievable. COMPARISON: CT head 08/10/2005. CLINICAL HISTORY: 74 year old male status post unobserved fall, found down at work. FINDINGS: BRAIN AND VENTRICLES: No acute intracranial hemorrhage. No mass effect or midline shift. No extra-axial fluid collection. No evidence of acute infarct. No hydrocephalus. Normal brain volume. Normal for age gray white differentiation. Calcified atherosclerosis at the skull base. No suspicious intracranial vascular hyperdensity. ORBITS: No acute abnormality. SINUSES AND MASTOIDS: Mild to moderate bilateral paranasal sinus mucosal thickening with some bubbly opacity, generally appears inflammatory although hyperdense material in the left frontal sinus. No visible facial bone fracture. Tympanic cavities and mastoids appear clear. SOFT TISSUES AND SKULL: No acute skull fracture. Left anterior superior forehead mild scalp soft tissue swelling with evidence of skin laceration. Small chronic dystrophic soft tissue calcification there was present in 2007. IMPRESSION: 1. Left forehead soft tissue injury. 2. Normal for age non-contrast CT appearance of the brain. 3. Paranasal sinus inflammation; trace hemorrhage in the left frontal sinus is felt less likely. Electronically signed by: Helayne Hurst MD 04/01/2024 08:34 AM EST RP Workstation: [id number]   DG Chest Portable 1 View Result Date: 04/01/2024 EXAM: 1 VIEW(S) XRAY OF THE CHEST 04/01/2024 08:20:27 AM COMPARISON: Chest CT 11/24/2019 and earlier. CLINICAL HISTORY: 74 year old male. Near syncope  evaluation. FINDINGS: LUNGS AND PLEURA: Mildly lower lung volumes with mild crowding of markings. No pleural effusion. No pneumothorax. HEART AND MEDIASTINUM: No acute abnormality of the cardiac and mediastinal silhouettes. BONES AND SOFT TISSUES: No acute osseous abnormality. IMPRESSION: 1. No acute cardiopulmonary abnormality. Electronically signed by: Helayne Hurst MD 04/01/2024 08:25 AM EST RP Workstation: [id number]     Discharge Instructions: Discharge Instructions     Call MD for:  difficulty breathing, headache or visual disturbances   Complete by: As directed    Call MD for:  extreme fatigue   Complete by: As directed    Call MD for:  persistant dizziness or light-headedness   Complete by: As directed    Increase activity slowly   Complete  by: As directed        Signed: Tycho Cheramie D'Mello 04/02/2024, 3:47 PM     "

## 2024-04-02 NOTE — Evaluation (Signed)
 Physical Therapy Evaluation Patient Details Name: Rodney Daniels MRN: [id number] DOB: Dec 11, 1950 Today's Date: 04/02/2024  History of Present Illness  74 y.o. male presents to [facility] 04/01/24 after a syncopal episode with head strike. Imaging negative. PMHx: tobacco use disorder, reactive airway disease, CAD  Clinical Impression  PTA pt was independent for mobility with no AD. Pt presents at mobility baseline with ability to ambulate 248ft independently with no AD. Reported having slightly decreased gait speed, however, no overt LOB or instability observed. Pt reported his head felt fuzzy at rest and with mobility with no change in symptoms throughout. Pt has intermittent assist available upon d/c home. Pt feels comfortable d/c home when medically stable with no further questions/concerns. No acute or post-acute PT needs identified with acute PT signing off. Please re-consult if new needs arise.     Seated BP: 121/94 (103), 68 BPM 100% SpO2 on RA      If plan is discharge home, recommend the following: Assist for transportation   Can travel by private vehicle    Yes    Equipment Recommendations None recommended by PT     Functional Status Assessment Patient has not had a recent decline in their functional status     Precautions / Restrictions Precautions Precautions: None Restrictions Weight Bearing Restrictions Per Provider Order: No      Mobility  Bed Mobility    General bed mobility comments: seated on EOB upon arrival    Transfers Overall transfer level: Independent Equipment used: None   Ambulation/Gait Ambulation/Gait assistance: Independent Gait Distance (Feet): 200 Feet Assistive device: None Gait Pattern/deviations: WFL(Within Functional Limits) Gait velocity: slightly decr        Balance Overall balance assessment: No apparent balance deficits (not formally assessed)       Pertinent Vitals/Pain Pain Assessment Pain Assessment: Faces Faces Pain  Scale: Hurts a little bit Pain Location: low back Pain Descriptors / Indicators: Discomfort Pain Intervention(s): Limited activity within patient's tolerance, Monitored during session, Repositioned    Home Living Family/patient expects to be discharged to:: Private residence Living Arrangements: Children;Other relatives (daughter, grandchildren) Available Help at Discharge: Family;Available PRN/intermittently Type of Home: House Home Access: Stairs to enter Entrance Stairs-Rails: Left Entrance Stairs-Number of Steps: 4   Home Layout: One level Home Equipment: None      Prior Function Prior Level of Function : Independent/Modified Independent;Working/employed;Driving;History of Falls (last six months)      Extremity/Trunk Assessment   Upper Extremity Assessment Upper Extremity Assessment: Overall WFL for tasks assessed    Lower Extremity Assessment Lower Extremity Assessment: Overall WFL for tasks assessed    Cervical / Trunk Assessment Cervical / Trunk Assessment: Normal  Communication   Communication Communication: No apparent difficulties    Cognition Arousal: Alert Behavior During Therapy: WFL for tasks assessed/performed   PT - Cognitive impairments: No apparent impairments    Following commands: Intact       Cueing Cueing Techniques: Verbal cues     General Comments General comments (skin integrity, edema, etc.): VSS on RA     PT Assessment Patient does not need any further PT services         PT Goals (Current goals can be found in the Care Plan section)  Acute Rehab PT Goals PT Goal Formulation: All assessment and education complete, DC therapy     AM-PAC PT 6 Clicks Mobility  Outcome Measure Help needed turning from your back to your side while in a flat bed without using bedrails?: None Help  needed moving from lying on your back to sitting on the side of a flat bed without using bedrails?: None Help needed moving to and from a bed to a  chair (including a wheelchair)?: None Help needed standing up from a chair using your arms (e.g., wheelchair or bedside chair)?: None Help needed to walk in hospital room?: None Help needed climbing 3-5 steps with a railing? : None 6 Click Score: 24    End of Session   Activity Tolerance: Patient tolerated treatment well Patient left: in bed;with call bell/phone within reach Nurse Communication: Mobility status PT Visit Diagnosis: Other abnormalities of gait and mobility (R26.89)    Time: 8878-8863 PT Time Calculation (min) (ACUTE ONLY): 15 min   Charges:   PT Evaluation $PT Eval Low Complexity: 1 Low   PT General Charges $$ ACUTE PT VISIT: 1 Visit        Kate ORN, PT, DPT Secure Chat Preferred  Rehab Office [phone]   Kate BRAVO Wendolyn 04/02/2024, 12:36 PM

## 2024-04-04 ENCOUNTER — Other Ambulatory Visit: Payer: Self-pay

## 2024-04-04 ENCOUNTER — Emergency Department (HOSPITAL_COMMUNITY)
Admission: EM | Admit: 2024-04-04 | Discharge: 2024-04-04 | Disposition: A | Discharge status: Home / Self Care | Attending: Emergency Medicine | Admitting: Emergency Medicine

## 2024-04-04 ENCOUNTER — Emergency Department (HOSPITAL_COMMUNITY)

## 2024-04-04 ENCOUNTER — Encounter (HOSPITAL_COMMUNITY): Payer: Self-pay

## 2024-04-04 DIAGNOSIS — I10 Essential (primary) hypertension: Secondary | ICD-10-CM | POA: Diagnosis not present

## 2024-04-04 DIAGNOSIS — S0081XA Abrasion of other part of head, initial encounter: Secondary | ICD-10-CM | POA: Diagnosis present

## 2024-04-04 DIAGNOSIS — W19XXXA Unspecified fall, initial encounter: Secondary | ICD-10-CM | POA: Insufficient documentation

## 2024-04-04 DIAGNOSIS — R079 Chest pain, unspecified: Secondary | ICD-10-CM | POA: Diagnosis not present

## 2024-04-04 DIAGNOSIS — Z7982 Long term (current) use of aspirin: Secondary | ICD-10-CM | POA: Diagnosis not present

## 2024-04-04 DIAGNOSIS — Z79899 Other long term (current) drug therapy: Secondary | ICD-10-CM | POA: Diagnosis not present

## 2024-04-04 HISTORY — DX: Essential (primary) hypertension: I10

## 2024-04-04 HISTORY — DX: Pure hypercholesterolemia, unspecified: E78.00

## 2024-04-04 LAB — TROPONIN T, HIGH SENSITIVITY
Troponin T High Sensitivity: <15 ng/L (ref 0–19)
Troponin T High Sensitivity: <15 ng/L (ref 0–19)

## 2024-04-04 LAB — BASIC METABOLIC PANEL WITH GFR
Anion gap: 14 (ref 5–15)
BUN: 14 mg/dL (ref 8–23)
CO2: 20 mmol/L — ABNORMAL LOW (ref 22–32)
Calcium: 9 mg/dL (ref 8.9–10.3)
Chloride: 106 mmol/L (ref 98–111)
Creatinine, Ser: 0.91 mg/dL (ref 0.61–1.24)
GFR, Estimated: >60 mL/min
Glucose, Bld: 69 mg/dL — ABNORMAL LOW (ref 70–99)
Potassium: 4 mmol/L (ref 3.5–5.1)
Sodium: 140 mmol/L (ref 135–145)

## 2024-04-04 LAB — CBC
HCT: 43 % (ref 39.0–52.0)
Hemoglobin: 14.5 g/dL (ref 13.0–17.0)
MCH: 29.4 pg (ref 26.0–34.0)
MCHC: 33.7 g/dL (ref 30.0–36.0)
MCV: 87.2 fL (ref 80.0–100.0)
Platelets: 196 K/uL (ref 150–400)
RBC: 4.93 MIL/uL (ref 4.22–5.81)
RDW: 12.2 % (ref 11.5–15.5)
WBC: 7.4 K/uL (ref 4.0–10.5)
nRBC: 0 % (ref 0.0–0.2)

## 2024-04-04 NOTE — ED Triage Notes (Signed)
 Pt reports he had a fall last week and was admitted to [place] and they thought it might be his heart that caused it but they weren't sure.  Pt reports he came home a day or two ago and while at the store today he began to feel weak all over and had some mild chest pain.

## 2024-04-04 NOTE — Discharge Instructions (Signed)
 Put the monitor on once you get home.  Follow-up with cardiologist as planned.

## 2024-04-04 NOTE — ED Provider Notes (Signed)
 " [place] EMERGENCY DEPARTMENT AT [facility] Provider Note   CSN: [id number] Arrival date & time: 04/04/24  1110     Patient presents with: Chest Pain   Rodney Daniels is a 74 y.o. male.    Chest Pain Patient presents with chest pain.  Anterior chest.  Without shopping today and felt some pain.  States a few extra beats on his chest.  Did have recent admission in the hospital and discharged 2 days ago.  Had syncopal episode and elevated troponin at that time.  Echocardiogram and monitoring reassuring then.  Has a cardiac monitor ordered but is not wearing it yet.  No syncope today just some pain that is feeling better now.  See she was feeling weak all over while the episode happened.    Past Medical History:  Diagnosis Date   Allergy    History of chicken pox    Hypercholesteremia    Hypertension     Prior to Admission medications  Medication Sig Start Date End Date Taking? Authorizing Provider  amLODipine  (NORVASC ) 5 MG tablet Take 1 tablet (5 mg total) by mouth daily. 04/03/24   D'Mello, Rosalyn, DO  aspirin  EC 81 MG tablet Take 1 tablet (81 mg total) by mouth daily. Patient not taking: Reported on 04/01/2024 03/18/19   Lonni Slain, MD  atorvastatin  (LIPITOR) 40 MG tablet Take 1 tablet (40 mg total) by mouth daily. 04/03/24   D'Mello, Rosalyn, DO    Allergies: Patient has no known allergies.    Review of Systems  Cardiovascular:  Positive for chest pain.    Updated Vital Signs BP 130/85   Pulse 72   Temp 97.6 F (36.4 C)   Resp 18   Wt 83.5 kg   SpO2 96%   BMI 24.28 kg/m   Physical Exam Vitals and nursing note reviewed.  HENT:     Head:     Comments: Abrasion to left forehead from previous fall. Cardiovascular:     Rate and Rhythm: Regular rhythm.  Pulmonary:     Breath sounds: No decreased breath sounds.  Chest:     Chest wall: No tenderness.  Abdominal:     Tenderness: There is no abdominal tenderness.  Musculoskeletal:      Right lower leg: No edema.     Left lower leg: No edema.  Skin:    General: Skin is warm.  Neurological:     Mental Status: He is alert.     (all labs ordered are listed, but only abnormal results are displayed) Labs Reviewed  BASIC METABOLIC PANEL WITH GFR - Abnormal; Notable for the following components:      Result Value   CO2 20 (*)    Glucose, Bld 69 (*)    All other components within normal limits  CBC  TROPONIN T, HIGH SENSITIVITY  TROPONIN T, HIGH SENSITIVITY    EKG: EKG Interpretation Date/Time:  Friday April 04 2024 11:20:57 EST Ventricular Rate:  85 PR Interval:  140 QRS Duration:  82 QT Interval:  358 QTC Calculation: 426 R Axis:   45  Text Interpretation: Normal sinus rhythm Normal ECG When compared with ECG of 02-Apr-2024 10:07, nonspecific  inferior ST changes Confirmed by Patsey Lot [phone]) on 04/04/2024 11:34:20 AM  Radiology: [facility] Chest Portable 1 View Result Date: 04/04/2024 EXAM: 1 VIEW(S) XRAY OF THE CHEST 04/04/2024 11:54:13 AM COMPARISON: 04/01/2024 CLINICAL HISTORY: chest pain FINDINGS: LUNGS AND PLEURA: No focal pulmonary opacity. No pleural effusion. No pneumothorax. HEART AND  MEDIASTINUM: Aortic atherosclerosis. No acute abnormality of the cardiac and mediastinal silhouettes. BONES AND SOFT TISSUES: Thoracic osteophytosis. No acute osseous abnormality. IMPRESSION: 1. No acute cardiopulmonary abnormality. Electronically signed by: Rogelia Myers MD MD 04/04/2024 12:45 PM EST RP Workstation: [id number]     Procedures   Medications Ordered in the ED - No data to display                                  Medical Decision Making Amount and/or Complexity of Data Reviewed Labs: ordered. Radiology: ordered.   Patient with episode of chest pain.  Began while shopping today.  Recent admission in the hospital and positive troponins at that time.  Reviewed cardiology note from that visit.  Reviewed also previous CT coronary.  Not wearing cardiac  monitor yet unfortunately.  Will get troponin.  EKG reassuring.  Will also get chest x-ray.  Will monitor while in the ER.   Monitor here reassuring.  Feeling better.  Troponin negative x 2.  Discussed with Dr. Debera from cardiology.  And discussed with family member and found out that the monitor is getting delivered today.  This should be could not follow-up.  Will discharge home.     Final diagnoses:  Nonspecific chest pain    ED Discharge Orders     None          Patsey Lot, MD 04/04/24 1521  "

## 2024-04-16 ENCOUNTER — Ambulatory Visit: Payer: Self-pay

## 2024-04-16 VITALS — BP 125/79 | HR 89 | Temp 98.6°F | Ht 73.0 in | Wt 186.2 lb

## 2024-04-16 DIAGNOSIS — R55 Syncope and collapse: Secondary | ICD-10-CM

## 2024-04-16 DIAGNOSIS — Z139 Encounter for screening, unspecified: Secondary | ICD-10-CM

## 2024-04-16 DIAGNOSIS — F17210 Nicotine dependence, cigarettes, uncomplicated: Secondary | ICD-10-CM | POA: Diagnosis not present

## 2024-04-16 DIAGNOSIS — F17200 Nicotine dependence, unspecified, uncomplicated: Secondary | ICD-10-CM

## 2024-04-16 NOTE — Patient Instructions (Addendum)
 It was wonderful seeing you today!   1) Go to your cardiology appt on 2/3  2) Keep taking your amlodipine  and atorvastatin  for your blood pressure and cholesterol   3) Be on the lookout for someone to call you about scheduling for colonoscopy and lung cancer screening   4) Try to cut back on smoking and drinking, I know you can do it!  If you have any questions please feel free to the call the clinic at anytime at [phone].  Have a blessed day,  Dr. Charmayne

## 2024-04-17 NOTE — Assessment & Plan Note (Signed)
 Discharged on 1/7 after presenting to the emergency department for a syncopal episode without prodrome.  Workup in the ED was unremarkable for head trauma.  He was seen by cardiology who recommended cardiac monitor and outpatient ischemic evaluation.  Known history of nonobstructive coronary artery disease.  Discharged from hospital on an increased dose of atorvastatin  to 40 mg daily, new aspirin  81 mg daily and new amlodipine  5 mg daily.  Today he is doing well, he has no complaints and has not had a syncopal episode since he arrived to the emergency department.  He is taking the amlodipine  and the atorvastatin  but is not taking aspirin .  He already has ringing in his ears and does not want to make it worse.  He has a cardiology follow-up scheduled for 2/3 and is wearing the cardiac monitor. Plan Continue amlodipine  5 and atorvastatin  40 mg daily Cardiology follow-up 2/3

## 2024-04-17 NOTE — Progress Notes (Signed)
 "  Established Patient Office Visit  Subjective   Patient ID: Rodney Daniels, male    DOB: [birth year]  Age: 74 y.o. MRN: [id number]  HPI Follow up for syncopal episode  The patient was last seen for this 13 days ago. Changes made at last visit include amlodipine  5 mg, aspirin  81 mg, atorvastatin  40 mg daily.  He reports fair compliance with treatment. He feels that condition is stable as he has not had any more syncopal episodes since discharge. He is not having side effects.   -----------------------------------------------------------------------------------------  Past Medical History:  Diagnosis Date   Allergy    History of chicken pox    Hypercholesteremia    Hypertension         Objective:     BP 125/79 (BP Location: Left Arm, Patient Position: Sitting, Cuff Size: Small)   Pulse 89   Temp 98.6 F (37 C) (Oral)   Ht 6' 1 (1.854 m)   Wt 186 lb 3.2 oz (84.5 kg)   SpO2 100%   BMI 24.57 kg/m     Physical Exam Constitutional:      Appearance: He is normal weight.  HENT:     Nose: Nose normal.     Mouth/Throat:     Mouth: Mucous membranes are moist.     Pharynx: Oropharynx is clear.  Eyes:     Conjunctiva/sclera: Conjunctivae normal.  Cardiovascular:     Rate and Rhythm: Normal rate and regular rhythm.     Comments: Distant heart sounds Pulmonary:     Effort: Pulmonary effort is normal.     Breath sounds: Normal breath sounds. No wheezing.  Skin:    Comments: Heart monitor patch in place on chest  Neurological:     General: No focal deficit present.     Mental Status: He is alert and oriented to person, place, and time.  Psychiatric:        Mood and Affect: Mood normal.        Behavior: Behavior normal.      No results found for any visits on 04/16/24.  Last CBC Lab Results  Component Value Date   WBC 7.4 04/04/2024   HGB 14.5 04/04/2024   HCT 43.0 04/04/2024   MCV 87.2 04/04/2024   MCH 29.4 04/04/2024   RDW 12.2 04/04/2024   PLT  196 04/04/2024   Last metabolic panel Lab Results  Component Value Date   GLUCOSE 69 (L) 04/04/2024   NA 140 04/04/2024   K 4.0 04/04/2024   CL 106 04/04/2024   CO2 20 (L) 04/04/2024   BUN 14 04/04/2024   CREATININE 0.91 04/04/2024   GFRNONAA >60 04/04/2024   CALCIUM  9.0 04/04/2024   PROT 5.9 (L) 04/02/2024   ALBUMIN 3.6 04/02/2024   BILITOT 0.7 04/02/2024   ALKPHOS 77 04/02/2024   AST 17 04/02/2024   ALT 12 04/02/2024   ANIONGAP 14 04/04/2024   Last thyroid  functions Lab Results  Component Value Date   TSH 1.020 04/01/2024      The 10-year ASCVD risk score (Arnett DK, et al., 2019) is: 28%    Assessment & Plan:   Assessment & Plan Syncope and collapse Discharged on 1/7 after presenting to the emergency department for a syncopal episode without prodrome.  Workup in the ED was unremarkable for head trauma.  He was seen by cardiology who recommended cardiac monitor and outpatient ischemic evaluation.  Known history of nonobstructive coronary artery disease.  Discharged from hospital on an increased dose of  atorvastatin  to 40 mg daily, new aspirin  81 mg daily and new amlodipine  5 mg daily.  Today he is doing well, he has no complaints and has not had a syncopal episode since he arrived to the emergency department.  He is taking the amlodipine  and the atorvastatin  but is not taking aspirin .  He already has ringing in his ears and does not want to make it worse.  He has a cardiology follow-up scheduled for 2/3 and is wearing the cardiac monitor. Plan Continue amlodipine  5 and atorvastatin  40 mg daily Cardiology follow-up 2/3    Tobacco use disorder Current.  Smokes about a pack a day.  He has stopped smoking in the past and believes that if he wants to stop he can.  He is not interested in any medications for cessation at this time but is open to the idea of trying to decrease the number of cigarettes he smokes a day.  He is also agreeable to getting lung cancer screening  referral today. Plan Lung cancer screening Follow-up cessation efforts at next appointment Orders:   CT CHEST LUNG CA SCREEN LOW DOSE W/O CM; Future  Screening due Age recommended screening, ordered today. Orders:   Amb Referral to Colonoscopy   Return in about 6 weeks (around 05/28/2024) for new patient visit and follow up .    Viktoria King, DO "

## 2024-04-18 NOTE — Progress Notes (Signed)
 Internal Medicine Clinic Attending  Case discussed with the resident at the time of the visit.  We reviewed the resident's history and exam and pertinent patient test results.  I agree with the assessment, diagnosis, and plan of care documented in the resident's note.

## 2024-04-23 ENCOUNTER — Telehealth: Payer: Self-pay | Admitting: Student

## 2024-04-23 NOTE — Telephone Encounter (Signed)
 Copied from CRM #[id number]. Topic: Medical Record Request - Payor/Billing Request >> Apr 21, 2024 12:16 PM Graeme ORN wrote: Reason for CRM: Received a call from [employer] - check to see if fax received. Sent Friday - Attending physician form sent for Dr Harrie to complete. Thank You >> Apr 23, 2024 12:53 PM Mercer PEDLAR wrote: Wanda is calling from [employer] to check status of physician statement. She is requesting a callback with update.   Reference number: [id number] Callback: [phone]

## 2024-04-23 NOTE — Telephone Encounter (Signed)
 I did received the form for this pt, I gave it to Dr. Charmayne to complete. Forwarding this message to Dr. Charmayne.

## 2024-04-24 NOTE — Addendum Note (Signed)
 Addended by: CHARMAYNE HOLMES on: 04/24/2024 01:29 PM   Modules accepted: Level of Service

## 2024-04-24 NOTE — Telephone Encounter (Signed)
 I contacted pt to let him that his form has been completed and faxed, confirmation went through. Advised pt to call back if he needs further assistance.

## 2024-04-29 ENCOUNTER — Ambulatory Visit (HOSPITAL_BASED_OUTPATIENT_CLINIC_OR_DEPARTMENT_OTHER): Admitting: Family

## 2024-05-09 ENCOUNTER — Ambulatory Visit (HOSPITAL_BASED_OUTPATIENT_CLINIC_OR_DEPARTMENT_OTHER): Admitting: Family

## 2024-05-29 ENCOUNTER — Ambulatory Visit: Payer: Self-pay | Admitting: Student
